# Patient Record
Sex: Male | Born: 1960 | Race: Black or African American | Hispanic: No | Marital: Single | Smoking: Current every day smoker
Health system: Southern US, Community
[De-identification: ages and names within clinical notes are randomized; demographics above are authoritative.]

## PROBLEM LIST (undated history)

## (undated) DIAGNOSIS — I639 Cerebral infarction, unspecified: Secondary | ICD-10-CM

## (undated) DIAGNOSIS — I10 Essential (primary) hypertension: Secondary | ICD-10-CM

## (undated) DIAGNOSIS — R569 Unspecified convulsions: Secondary | ICD-10-CM

## (undated) HISTORY — PX: HERNIA REPAIR: SHX51

---

## 2004-09-10 ENCOUNTER — Ambulatory Visit: Payer: Self-pay | Admitting: Family Medicine

## 2004-09-22 ENCOUNTER — Ambulatory Visit: Payer: Self-pay | Admitting: Internal Medicine

## 2004-09-22 ENCOUNTER — Ambulatory Visit: Payer: Self-pay | Admitting: *Deleted

## 2004-09-29 ENCOUNTER — Ambulatory Visit: Payer: Self-pay | Admitting: Internal Medicine

## 2006-05-26 ENCOUNTER — Emergency Department (HOSPITAL_COMMUNITY): Admission: EM | Admit: 2006-05-26 | Discharge: 2006-05-26 | Payer: Self-pay | Admitting: Family Medicine

## 2007-04-11 ENCOUNTER — Emergency Department (HOSPITAL_COMMUNITY): Admission: EM | Admit: 2007-04-11 | Discharge: 2007-04-11 | Payer: Self-pay | Admitting: Emergency Medicine

## 2007-05-28 ENCOUNTER — Emergency Department (HOSPITAL_COMMUNITY): Admission: EM | Admit: 2007-05-28 | Discharge: 2007-05-28 | Payer: Self-pay | Admitting: Emergency Medicine

## 2010-11-19 ENCOUNTER — Inpatient Hospital Stay (HOSPITAL_COMMUNITY)
Admission: EM | Admit: 2010-11-19 | Discharge: 2010-11-29 | Disposition: A | Discharge status: Rehabilitation Facility | Payer: Self-pay | Source: Home / Self Care | Attending: Neurology | Admitting: Neurology

## 2010-11-22 LAB — COMPREHENSIVE METABOLIC PANEL
ALT: 10 U/L (ref 0–53)
AST: 20 U/L (ref 0–37)
Albumin: 4.3 g/dL (ref 3.5–5.2)
Alkaline Phosphatase: 85 U/L (ref 39–117)
BUN: 7 mg/dL (ref 6–23)
CO2: 23 mEq/L (ref 19–32)
Calcium: 9.4 mg/dL (ref 8.4–10.5)
Chloride: 105 mEq/L (ref 96–112)
Creatinine, Ser: 1.03 mg/dL (ref 0.4–1.5)
GFR calc Af Amer: >60 mL/min (ref >60)
GFR calc non Af Amer: >60 mL/min (ref >60)
Glucose, Bld: 98 mg/dL (ref 70–99)
Potassium: 3.3 mEq/L — ABNORMAL LOW (ref 3.5–5.1)
Sodium: 140 mEq/L (ref 135–145)
Total Bilirubin: 0.9 mg/dL (ref 0.3–1.2)
Total Protein: 7.7 g/dL (ref 6.0–8.3)

## 2010-11-22 LAB — DIFFERENTIAL
Basophils Absolute: 0 10*3/uL (ref 0.0–0.1)
Basophils Relative: 0 % (ref 0–1)
Eosinophils Absolute: 0.1 10*3/uL (ref 0.0–0.7)
Eosinophils Relative: 1 % (ref 0–5)
Lymphocytes Relative: 27 % (ref 12–46)
Lymphs Abs: 2.7 10*3/uL (ref 0.7–4.0)
Monocytes Absolute: 0.5 10*3/uL (ref 0.1–1.0)
Monocytes Relative: 6 % (ref 3–12)
Neutro Abs: 6.5 10*3/uL (ref 1.7–7.7)
Neutrophils Relative %: 66 % (ref 43–77)

## 2010-11-22 LAB — GLUCOSE, CAPILLARY
Glucose-Capillary: 120 mg/dL — ABNORMAL HIGH (ref 70–99)
Glucose-Capillary: 126 mg/dL — ABNORMAL HIGH (ref 70–99)
Glucose-Capillary: 127 mg/dL — ABNORMAL HIGH (ref 70–99)
Glucose-Capillary: 138 mg/dL — ABNORMAL HIGH (ref 70–99)
Glucose-Capillary: 121 mg/dL — ABNORMAL HIGH (ref 70–99)
Glucose-Capillary: 133 mg/dL — ABNORMAL HIGH (ref 70–99)
Glucose-Capillary: 138 mg/dL — ABNORMAL HIGH (ref 70–99)
Glucose-Capillary: 183 mg/dL — ABNORMAL HIGH (ref 70–99)

## 2010-11-22 LAB — RAPID URINE DRUG SCREEN, HOSP PERFORMED
Amphetamines: NOT DETECTED
Barbiturates: NOT DETECTED
Benzodiazepines: NOT DETECTED
Cocaine: POSITIVE — AB
Opiates: NOT DETECTED
Tetrahydrocannabinol: NOT DETECTED

## 2010-11-22 LAB — BASIC METABOLIC PANEL
BUN: 7 mg/dL (ref 6–23)
CO2: 25 mEq/L (ref 19–32)
Calcium: 9.6 mg/dL (ref 8.4–10.5)
Chloride: 101 mEq/L (ref 96–112)
Creatinine, Ser: 0.94 mg/dL (ref 0.4–1.5)
GFR calc Af Amer: >60 mL/min (ref >60)
GFR calc non Af Amer: >60 mL/min (ref >60)
Glucose, Bld: 111 mg/dL — ABNORMAL HIGH (ref 70–99)
Potassium: 4.2 mEq/L (ref 3.5–5.1)
Sodium: 135 mEq/L (ref 135–145)

## 2010-11-22 LAB — CBC
HCT: 42.8 % (ref 39.0–52.0)
HCT: 41.7 % (ref 39.0–52.0)
Hemoglobin: 15.7 g/dL (ref 13.0–17.0)
Hemoglobin: 15.1 g/dL (ref 13.0–17.0)
MCH: 30.4 pg (ref 26.0–34.0)
MCH: 30.3 pg (ref 26.0–34.0)
MCHC: 36.7 g/dL — ABNORMAL HIGH (ref 30.0–36.0)
MCHC: 36.2 g/dL — ABNORMAL HIGH (ref 30.0–36.0)
MCV: 82.8 fL (ref 78.0–100.0)
MCV: 83.6 fL (ref 78.0–100.0)
Platelets: 139 10*3/uL — ABNORMAL LOW (ref 150–400)
Platelets: 131 10*3/uL — ABNORMAL LOW (ref 150–400)
RBC: 5.17 MIL/uL (ref 4.22–5.81)
RBC: 4.99 MIL/uL (ref 4.22–5.81)
RDW: 18.2 % — ABNORMAL HIGH (ref 11.5–15.5)
RDW: 17.9 % — ABNORMAL HIGH (ref 11.5–15.5)
WBC: 9.8 10*3/uL (ref 4.0–10.5)
WBC: 11.7 10*3/uL — ABNORMAL HIGH (ref 4.0–10.5)

## 2010-11-22 LAB — TROPONIN I: Troponin I: 0.02 ng/mL (ref 0.00–0.06)

## 2010-11-22 LAB — CK TOTAL AND CKMB (NOT AT ARMC)
CK, MB: 1.9 ng/mL (ref 0.3–4.0)
Relative Index: INVALID (ref 0.0–2.5)
Total CK: 81 U/L (ref 7–232)

## 2010-11-22 LAB — PROTIME-INR
INR: 0.98 (ref 0.00–1.49)
Prothrombin Time: 13.2 seconds (ref 11.6–15.2)

## 2010-11-22 LAB — MRSA PCR SCREENING: MRSA by PCR: NEGATIVE

## 2010-11-22 LAB — APTT: aPTT: 33 seconds (ref 24–37)

## 2010-11-24 LAB — LIPID PANEL
Cholesterol: 163 mg/dL (ref 0–200)
HDL: 47 mg/dL (ref >39)
LDL Cholesterol: 98 mg/dL (ref 0–99)
Total CHOL/HDL Ratio: 3.5 RATIO
Triglycerides: 92 mg/dL (ref <150)
VLDL: 18 mg/dL (ref 0–40)

## 2010-11-24 LAB — GLUCOSE, CAPILLARY
Glucose-Capillary: 109 mg/dL — ABNORMAL HIGH (ref 70–99)
Glucose-Capillary: 118 mg/dL — ABNORMAL HIGH (ref 70–99)
Glucose-Capillary: 130 mg/dL — ABNORMAL HIGH (ref 70–99)
Glucose-Capillary: 117 mg/dL — ABNORMAL HIGH (ref 70–99)
Glucose-Capillary: 130 mg/dL — ABNORMAL HIGH (ref 70–99)
Glucose-Capillary: 139 mg/dL — ABNORMAL HIGH (ref 70–99)
Glucose-Capillary: 154 mg/dL — ABNORMAL HIGH (ref 70–99)
Glucose-Capillary: 167 mg/dL — ABNORMAL HIGH (ref 70–99)
Glucose-Capillary: 175 mg/dL — ABNORMAL HIGH (ref 70–99)

## 2010-11-29 LAB — GLUCOSE, CAPILLARY
Glucose-Capillary: 111 mg/dL — ABNORMAL HIGH (ref 70–99)
Glucose-Capillary: 128 mg/dL — ABNORMAL HIGH (ref 70–99)
Glucose-Capillary: 137 mg/dL — ABNORMAL HIGH (ref 70–99)
Glucose-Capillary: 140 mg/dL — ABNORMAL HIGH (ref 70–99)
Glucose-Capillary: 148 mg/dL — ABNORMAL HIGH (ref 70–99)
Glucose-Capillary: 153 mg/dL — ABNORMAL HIGH (ref 70–99)
Glucose-Capillary: 107 mg/dL — ABNORMAL HIGH (ref 70–99)
Glucose-Capillary: 128 mg/dL — ABNORMAL HIGH (ref 70–99)
Glucose-Capillary: 136 mg/dL — ABNORMAL HIGH (ref 70–99)
Glucose-Capillary: 179 mg/dL — ABNORMAL HIGH (ref 70–99)
Glucose-Capillary: 142 mg/dL — ABNORMAL HIGH (ref 70–99)
Glucose-Capillary: 143 mg/dL — ABNORMAL HIGH (ref 70–99)
Glucose-Capillary: 151 mg/dL — ABNORMAL HIGH (ref 70–99)
Glucose-Capillary: 172 mg/dL — ABNORMAL HIGH (ref 70–99)

## 2010-11-30 LAB — GLUCOSE, CAPILLARY
Glucose-Capillary: 131 mg/dL — ABNORMAL HIGH (ref 70–99)
Glucose-Capillary: 124 mg/dL — ABNORMAL HIGH (ref 70–99)
Glucose-Capillary: 153 mg/dL — ABNORMAL HIGH (ref 70–99)
Glucose-Capillary: 125 mg/dL — ABNORMAL HIGH (ref 70–99)
Glucose-Capillary: 134 mg/dL — ABNORMAL HIGH (ref 70–99)
Glucose-Capillary: 160 mg/dL — ABNORMAL HIGH (ref 70–99)
Glucose-Capillary: 216 mg/dL — ABNORMAL HIGH (ref 70–99)
Glucose-Capillary: 132 mg/dL — ABNORMAL HIGH (ref 70–99)

## 2010-11-30 LAB — URINALYSIS, ROUTINE W REFLEX MICROSCOPIC
Bilirubin Urine: NEGATIVE
Ketones, ur: 15 mg/dL — AB
Specific Gravity, Urine: 1.015 (ref 1.005–1.030)
Urine Glucose, Fasting: NEGATIVE mg/dL
pH: 6 (ref 5.0–8.0)

## 2010-11-30 LAB — URINE MICROSCOPIC-ADD ON

## 2010-12-01 LAB — URINE CULTURE

## 2010-12-11 NOTE — Discharge Summary (Signed)
Eugene [name], Eugene [name]               ACCOUNT NO.:  [id number]  MEDICAL RECORD NO.:  [id number]          PATIENT TYPE:  INP  LOCATION:  3027                         FACILITY:  [facility]  PHYSICIAN:  Radhika Dershem P. Pearlean Brownie, MD    DATE OF BIRTH:  [birth year]  DATE OF ADMISSION:  11/19/2010 DATE OF DISCHARGE:  11/26/2010                        DISCHARGE SUMMARY - REFERRING   DISCHARGE DIAGNOSES: 1. 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 2. Hypertension. 3. Cocaine use.  MEDICINES AT TIME OF DISCHARGE: 1. Hydrochlorothiazide 25 mg a day. 2. Lisinopril 10 mg a day. 3. Clonidine 0.3 mg q.8 h.  STUDIES PERFORMED: 1. CT of the brain on admission shows large left frontal lobe     parenchymal hematoma with associated mass effect upon the adjacent     brain parenchyma with mild left-to-right midline shift.     Intraventricular hemorrhage.  Small vessel disease. 2. MRI of the brain shows large acute hematoma in the left frontal     parietal lobe.  Intraventricular hemorrhage.  No underlying mass or     lesion is noted. 3. MRA of the brain shows moderate stenosis distal right vertebral     artery.  No large vessel or vascular malformation.  Cannot exclude     vascular malformation and ACA. 4. EKG shows sinus rhythm.  LABORATORY STUDIES:  Cholesterol 163, triglycerides 92, HDL 47, LDL 98. Chemistry with glucose 111, otherwise normal.  CBC with white blood cells 11.7, hemoglobin 15.1, hematocrit 41.7, platelets 130.  MRSA negative.  Urine drug screen positive for cocaine.  Coagulation studies normal.  Cardiac enzymes normal.  HISTORY OF PRESENT ILLNESS:  Eugene [name] is a 50 year old African American male with past medical history of hypertension.  The patient was found by his girlfriend at 2 a.m. on the morning of November 19, 2010.  The patient was found to get up to go to the bathroom and it was noted he was stumbling and his words were incorrect.   EMS was called and the patient was brought to [facility] Emergency Department.  In the emergency department, CT of the brain showed a large left frontal lobe intraparenchymal hemorrhage which measured 6.7 x 8.0 x 0.6.  There was also left-to-right midline shift measuring 6 mm.  The patient remained stable in the emergency department.  However, his blood pressure was significantly high.  He was started on labetalol.  The patient was started on Dilantin for seizure prophylaxis.  No seizure occurred.  He was admitted to the neuro ICU.  HOSPITAL COURSE:  EEG was negative for seizure.  Dilantin was eventually stopped.  MRI confirmed the hemorrhage with no underlying etiology such as aneurysm or other lesion.  He remained stable in the ICU. He was transferred to the floor.  He was evaluated by PT, OT and speech therapy. All agreed his need for rehab. As patient's girlfriend is in [place], but his daughter is in [place], the daughter preferred rehab in [place]. Daughter applied for medicaid, rehab bed was obtained in [place] and the patient is stable for transfer.  Blood pressure remained  a significant issue during his hospitalization. At time of discharge, he is on 3 agents - clonidine at 0.3 q.8 h. as well as hydrochlorothiazide and lisinopril.  This has allowed his blood pressure to drop into the 140's to 80's.  He is stable for ongoing therapy  given his elevated blood pressure.  Given his high doses of hypertensives, his blood pressure needs to be watched closely and clonidine decreased if blood pressure decreases.  CONDITION AT TIME OF DISCHARGE:  Patient awake and alert.  He is oriented to person, though he has expressive aphasia.  He has a dense right hematemesis, though he moves his left side well.  He follows commands.  He has right lower facial weakness.  His heart rate is regular.  His breath sounds are clear.  DISCHARGE/PLAN: 1. Transfer to [facility]  for rehab hospital admission     for ongoing PT, OT and speech therapy. 2. The patient's daughter to transport to [place] in private car. 3. Ongoing blood pressure management with eventual goal 130/90 or     less. 4. Follow up with primary care physician in 1 month. 5. Follow up with Dr. Delia Heady or neurologist in [place] in 2-3     months.     Eugene [name], N.P.   ______________________________ Sunny Schlein. Pearlean Brownie, MD    SB/MEDQ  D:  11/26/2010  T:  11/26/2010  Job:  [id number]  cc:   Addalyn Speedy P. Pearlean Brownie, MD  Electronically Signed by Eugene [name] N.P. on 12/06/2010 04:18:17 PM Electronically Signed by Delia Heady MD on 12/11/2010 01:12:53 PM

## 2010-12-22 NOTE — H&P (Signed)
NAMEJULIANO, Eugene [name]               ACCOUNT NO.:  [id number]  MEDICAL RECORD NO.:  [id number]           PATIENT TYPE:  LOCATION:                                 FACILITY:  PHYSICIAN:  Melvyn Novas, M.D.  DATE OF BIRTH:  January 10, 1961  DATE OF ADMISSION: DATE OF DISCHARGE:                             HISTORY & PHYSICAL   REASON FOR ADMISSION:  Large, left intracranial hemorrhage.  HISTORY OF PRESENT ILLNESS:  This is a 50 year old African American male with past medical history of hypertension.  The patient was found by his girlfriend at approximately 2:00 a.m. on the morning of November 19, 2009.  The patient was found to get up to go to the bathroom, and it was noted that he was stumbling and that his words were incorrect.  EMS was called, and patient was immediately brought to [facility] Emergency Department.  While in [facility] Emergency Department, patient obtained CT of his head which showed a large, left frontal lobe parenchymal hematoma which measured 6.7 x 8.0 x 0.6 cm.  There was also noted a left- to-right midline shift measuring 6 mm.  The patient remained stable while in the emergency department; however, his blood pressure was significantly high and he was started on labetalol.  Neurology was consulted at that time, at approximately 6:00 a.m. in the morning.  We arrived at approximately 8:30 in the morning to consult on the patient in the emergency department.  At that time, patient's blood pressure was 166/99, heart rate 84, and respirations 12.  The patient was stable and able to answer some questions; however, he showed expressive aphasia.  PAST MEDICAL HISTORY:  Positive for hypertension.  Stroke risk factor of hypertension.  MEDICATIONS:  The patient takes no medications at home.  ALLERGIES:  The patient is not allergic to any medications or environmental factors.  FAMILY HISTORY:  Noncontributory.  SOCIAL HISTORY:  The patient does take part in illicit  drug use, which includes cocaine.  Drinks occasionally.  Does not smoke.  REVIEW OF SYSTEMS:  Not obtainable at the present time.  PHYSICAL EXAMINATION:  VITAL SIGNS:  Temperature is 98.1, blood pressure 166/99, heart rate 84, respirations 12. LUNGS:  Clear to auscultation bilaterally. CARDIOVASCULAR:  Regular rate and rhythm. NECK:  Negative bruits. ABDOMEN:  Soft, nontender, nondistended. SKIN:  Warm, dry, and intact. NEUROLOGIC:  Pupils are equal, round, reactive to light and accommodating.  Extraocular movements grossly intact.  The patient does show mild slurred speech and mild expressive aphasia.  The patient is able to tell me his name, but he is unable to make sentences to tell us where he is and what had happened to him.  He does not show any facial droop and facial sensation is stated to be intact bilaterally.  The patient's smile is symmetrical and tongue is midline.  The patient is lying supine in the hospital bed.  He is able to move his left arm and left leg without any difficulty; however, his right arm, he shows effort to move it against gravity and makes attempt, but shows minimal strength.  The patient's right grip is  minimal.  The patient's right leg shows no effort against gravity; however, his tone throughout bilateral upper and lower extremities is intact.  As far as vision, patient's vision was intact bilaterally.  Deep tendon reflexes are 1+, upper extremities bilaterally; 0 at the right patella, 2 on the left patella, 0 on the right Achilles, 1 at the left Achilles.  He has equivocal toe on the right and upgoing toe on the left.  When questioned about sensation, patient states that bilateral sensation is equal; however, he could not give a good in depth and detailed response such as pinprick; light touch was equal bilaterally.  He could merely tell us that his sensation, he could feel both sides.  TEST RESULTS:  Sodium is 140, potassium 3.3, chloride 105,  CO2 of 22, BUN 7, creatinine is 1.03, and glucose 98.  White blood cell count 9.8, hemoglobin 15.7, and hematocrit 42.8.  Tox screen was positive for cocaine.  EKG showed normal sinus rhythm.  CT of head, as noted above, shows large left frontal lobe parenchymal hematoma which measures 6.7 x 8 x 0.6 cm with a 6 mm shift, left to right.  Acute blood clot is noted within the left lateral ventricle as well as layering in the dependent portion of the right ventricle.  The majority of this bleed seems to have come from left basal ganglia.  ASSESSMENT:  This is a 50 year old male who presents to the emergency department with intracranial hemorrhage, most notable in the left basal ganglia with some access to the ventricles; this hemorrhage is most likely secondary to cocaine use.  PLAN:  At this point, we will admit the patient to 3100 neuro ICU and continue to monitor blood pressure, blood glucose, labs, and to continue with vital signs and neuro checks q.2 h.  We will also start patient on Dilantin IV load and continue with Dilantin for seizure prophylaxis.  In this consultation, approximately an hour and half were spent with patient and family; patient's previous scans, labs, medications. Diagnostic labs and values were obtained, in addition approximately 1 hour was spent speaking with family members outside the patient's room.     Felicie Morn, PA-C   ______________________________ Melvyn Novas, M.D.    DS/MEDQ  D:  11/19/2010  T:  11/20/2010  Job:  [id number]  Electronically Signed by Felicie Morn PA-C on 11/24/2010 12:20:00 PM Electronically Signed by Melvyn Novas M.D. on 12/22/2010 09:35:23 AM

## 2011-06-20 ENCOUNTER — Emergency Department (HOSPITAL_COMMUNITY): Payer: Medicaid Other

## 2011-06-20 ENCOUNTER — Emergency Department (HOSPITAL_COMMUNITY)
Admission: EM | Admit: 2011-06-20 | Discharge: 2011-06-20 | Disposition: A | Discharge status: Home / Self Care | Payer: Medicaid Other | Attending: Emergency Medicine | Admitting: Emergency Medicine

## 2011-06-20 DIAGNOSIS — R5381 Other malaise: Secondary | ICD-10-CM | POA: Insufficient documentation

## 2011-06-20 DIAGNOSIS — Z8679 Personal history of other diseases of the circulatory system: Secondary | ICD-10-CM | POA: Insufficient documentation

## 2011-06-20 DIAGNOSIS — K219 Gastro-esophageal reflux disease without esophagitis: Secondary | ICD-10-CM | POA: Insufficient documentation

## 2011-06-20 DIAGNOSIS — R55 Syncope and collapse: Secondary | ICD-10-CM | POA: Insufficient documentation

## 2011-06-20 DIAGNOSIS — R5383 Other fatigue: Secondary | ICD-10-CM | POA: Insufficient documentation

## 2011-06-20 DIAGNOSIS — I10 Essential (primary) hypertension: Secondary | ICD-10-CM | POA: Insufficient documentation

## 2011-06-20 LAB — DIFFERENTIAL
Basophils Absolute: 0.1 10*3/uL (ref 0.0–0.1)
Eosinophils Absolute: 0.1 10*3/uL (ref 0.0–0.7)
Eosinophils Relative: 1 % (ref 0–5)
Lymphocytes Relative: 17 % (ref 12–46)
Lymphs Abs: 1.6 10*3/uL (ref 0.7–4.0)
Neutrophils Relative %: 76 % (ref 43–77)

## 2011-06-20 LAB — BASIC METABOLIC PANEL
CO2: 29 mEq/L (ref 19–32)
Chloride: 102 mEq/L (ref 96–112)
Creatinine, Ser: 0.97 mg/dL (ref 0.50–1.35)
GFR calc Af Amer: >60 mL/min (ref >60)
Potassium: 3.7 mEq/L (ref 3.5–5.1)

## 2011-06-20 LAB — CBC
HCT: 36.8 % — ABNORMAL LOW (ref 39.0–52.0)
MCV: 77.8 fL — ABNORMAL LOW (ref 78.0–100.0)
Platelets: 122 10*3/uL — ABNORMAL LOW (ref 150–400)
RBC: 4.73 MIL/uL (ref 4.22–5.81)
RDW: 18.6 % — ABNORMAL HIGH (ref 11.5–15.5)
WBC: 9.3 10*3/uL (ref 4.0–10.5)

## 2011-10-08 ENCOUNTER — Emergency Department (HOSPITAL_COMMUNITY)
Admission: EM | Admit: 2011-10-08 | Discharge: 2011-10-08 | Disposition: A | Discharge status: Home / Self Care | Payer: Medicaid Other | Attending: Emergency Medicine | Admitting: Emergency Medicine

## 2011-10-08 ENCOUNTER — Encounter: Payer: Self-pay | Admitting: *Deleted

## 2011-10-08 DIAGNOSIS — Z79899 Other long term (current) drug therapy: Secondary | ICD-10-CM | POA: Insufficient documentation

## 2011-10-08 DIAGNOSIS — G40909 Epilepsy, unspecified, not intractable, without status epilepticus: Secondary | ICD-10-CM | POA: Insufficient documentation

## 2011-10-08 DIAGNOSIS — I10 Essential (primary) hypertension: Secondary | ICD-10-CM | POA: Insufficient documentation

## 2011-10-08 DIAGNOSIS — Z8673 Personal history of transient ischemic attack (TIA), and cerebral infarction without residual deficits: Secondary | ICD-10-CM | POA: Insufficient documentation

## 2011-10-08 HISTORY — DX: Unspecified convulsions: R56.9

## 2011-10-08 HISTORY — DX: Essential (primary) hypertension: I10

## 2011-10-08 HISTORY — DX: Cerebral infarction, unspecified: I63.9

## 2011-10-08 LAB — URINALYSIS, ROUTINE W REFLEX MICROSCOPIC
Glucose, UA: NEGATIVE mg/dL
Hgb urine dipstick: NEGATIVE
Ketones, ur: NEGATIVE mg/dL
Leukocytes, UA: NEGATIVE
Protein, ur: NEGATIVE mg/dL
pH: 6 (ref 5.0–8.0)

## 2011-10-08 LAB — CBC
HCT: 36.6 % — ABNORMAL LOW (ref 39.0–52.0)
Hemoglobin: 13.3 g/dL (ref 13.0–17.0)
MCV: 80.6 fL (ref 78.0–100.0)
RBC: 4.54 MIL/uL (ref 4.22–5.81)
WBC: 8.1 10*3/uL (ref 4.0–10.5)

## 2011-10-08 LAB — DIFFERENTIAL
Basophils Absolute: 0.1 10*3/uL (ref 0.0–0.1)
Eosinophils Relative: 2 % (ref 0–5)
Lymphocytes Relative: 25 % (ref 12–46)
Lymphs Abs: 2 10*3/uL (ref 0.7–4.0)
Monocytes Absolute: 0.5 10*3/uL (ref 0.1–1.0)
Monocytes Relative: 6 % (ref 3–12)
Neutro Abs: 5.4 10*3/uL (ref 1.7–7.7)

## 2011-10-08 LAB — BASIC METABOLIC PANEL
BUN: 16 mg/dL (ref 6–23)
CO2: 28 mEq/L (ref 19–32)
Calcium: 9.4 mg/dL (ref 8.4–10.5)
Chloride: 101 mEq/L (ref 96–112)
Creatinine, Ser: 1.19 mg/dL (ref 0.50–1.35)
Glucose, Bld: 108 mg/dL — ABNORMAL HIGH (ref 70–99)

## 2011-10-08 MED ORDER — LORAZEPAM 2 MG/ML IJ SOLN
1.0000 mg | Freq: Once | INTRAMUSCULAR | Status: AC
  Administered 2011-10-08: 1 mg via INTRAVENOUS
  Filled 2011-10-08: qty 1

## 2011-10-08 MED ORDER — LEVETIRACETAM 500 MG PO TABS
1000.0000 mg | ORAL_TABLET | Freq: Once | ORAL | Status: AC
  Administered 2011-10-08: 1000 mg via ORAL
  Filled 2011-10-08: qty 2

## 2011-10-08 MED ORDER — LEVETIRACETAM 250 MG PO TABS: 250.0000 mg | ORAL_TABLET | Freq: Two times a day (BID) | ORAL | Status: DC

## 2011-10-08 NOTE — ED Notes (Signed)
the patient states he was sitting in a chair when his right leg suddenly felt numb and he could not move it. the patient states he does remember the seizure. He states "the seizure came on gradually." the patient is A&O x 4 in no apparent distress. Will continue to monitor

## 2011-10-08 NOTE — ED Notes (Signed)
Family at bedside. 

## 2011-10-08 NOTE — ED Notes (Signed)
Requested urine and given a urinal

## 2011-10-08 NOTE — ED Provider Notes (Signed)
History     CSN: [id number] Arrival date & time: 10/08/2011  1:39 PM   None     Chief Complaint  Patient presents with  . Seizures    (Consider location/radiation/quality/duration/timing/severity/associated sxs/prior treatment) HPI History provided by pt, family member and prior chart.  Per prior chart, pt admitted to hospital for hemorrhagic stroke in 11/2010.  Pt reports that he developed a seizure disorder this year and his neurologist attributed to residual brain edema.  Refused to start seizure medication d/t potential SE.  Presents today w/ his second seizure.  Family members report that he turned his head to the left, eyes rolled to back of his head and he had full body tremors.  Seizure lasted for approx 10 minutes and he was confused for 15 minutes afterwards.  Did not bite tongue and was not incontinent of urine. No recent illnesses including fever, cough, vomiting, diarrhea, urinary sx.   Past Medical History  Diagnosis Date  . Seizures   . Hypertension   . Stroke     History reviewed. No pertinent past surgical history.  No family history on file.  History  Substance Use Topics  . Smoking status: Not on file  . Smokeless tobacco: Not on file  . Alcohol Use:       Review of Systems  All other systems reviewed and are negative.    Allergies  Review of patient's allergies indicates no known allergies.  Home Medications   Current Outpatient Rx  Name Route Sig Dispense Refill  . AMANTADINE HCL 100 MG PO CAPS Oral Take 100 mg by mouth daily.     . ASPIRIN EC 81 MG PO TBEC Oral Take 81 mg by mouth daily.      Marland Kitchen LOSARTAN POTASSIUM 100 MG PO TABS Oral Take 100 mg by mouth daily.      Marland Kitchen LOSARTAN POTASSIUM-HCTZ 100-25 MG PO TABS Oral Take 1 tablet by mouth daily.      Marland Kitchen PRAVASTATIN SODIUM 10 MG PO TABS Oral Take 10 mg by mouth daily.      Marland Kitchen PRAVASTATIN SODIUM 20 MG PO TABS Oral Take 20 mg by mouth daily.      Marland Kitchen VERAPAMIL HCL 120 MG PO TABS Oral Take 120 mg by  mouth 3 (three) times daily.      Marland Kitchen VERAPAMIL HCL 80 MG PO TABS Oral Take 80 mg by mouth 3 (three) times daily.        BP 140/88  Pulse 95  Temp(Src) 98.3 F (36.8 C) (Oral)  Resp 17  SpO2 100%  Physical Exam  Nursing note and vitals reviewed. Constitutional: He is oriented to person, place, and time. He appears well-developed and well-nourished. No distress.  HENT:  Head: Normocephalic and atraumatic.  Eyes:       Normal appearance  Neck: Normal range of motion.  Cardiovascular: Normal rate and regular rhythm.   Pulmonary/Chest: Effort normal and breath sounds normal.  Abdominal: Soft. Bowel sounds are normal. He exhibits no distension. There is no tenderness.  Musculoskeletal: Normal range of motion.  Neurological: He is alert and oriented to person, place, and time. He has normal reflexes. No cranial nerve deficit or sensory deficit. He displays a negative Romberg sign. Coordination and gait normal.       5/5 and equal upper and lower extremity strength.  No past pointing.    Skin: Skin is warm and dry. No rash noted.  Psychiatric: He has a normal mood and affect. His behavior is  normal.    ED Course  Procedures (including critical care time)  Labs Reviewed  CBC - Abnormal; Notable for the following:    HCT 36.6 (*)    MCHC 36.3 (*)    RDW 19.9 (*)    Platelets 125 (*)    All other components within normal limits  BASIC METABOLIC PANEL - Abnormal; Notable for the following:    Glucose, Bld 108 (*)    GFR calc non Af Amer 70 (*)    GFR calc Af Amer 81 (*)    All other components within normal limits  DIFFERENTIAL  URINALYSIS, ROUTINE W REFLEX MICROSCOPIC   No results found.   1. Seizure disorder       MDM  Pt developed seizure disorder following hemorrhagic stroke in 11/2010.  Has refused to take seizure medication in the past d/t fear of SE.  Presents today w/ witnessed seizure.  Pt looks well and no focal neuro deficits on exam.  Labs unremarkable.   Consulted Dr. Pearlean Brownie who recommends 1g Keppra loading dose, 250mg  bid and neuro f/u.  Pt agreeable to starting medication.  Return precautions discussed.      Otilio Miu, [place] 10/08/11 [phone]

## 2011-10-08 NOTE — ED Notes (Signed)
bs 131

## 2011-10-08 NOTE — ED Notes (Signed)
Claification:  Pt has only had 2 seizures, has not been dx with a seizure disorder, sts he saw a neuro md about 2 months ago

## 2011-10-09 NOTE — ED Provider Notes (Signed)
Medical screening examination/treatment/procedure(s) were performed by non-physician practitioner and as supervising physician I was immediately available for consultation/collaboration.   Dayton Bailiff, MD 10/09/11 [phone]

## 2011-10-12 ENCOUNTER — Encounter (HOSPITAL_COMMUNITY): Payer: Self-pay | Admitting: Emergency Medicine

## 2013-02-14 ENCOUNTER — Ambulatory Visit: Payer: Medicaid Other | Admitting: Physical Therapy

## 2013-02-15 ENCOUNTER — Ambulatory Visit: Payer: Medicaid Other | Attending: Internal Medicine | Admitting: Physical Therapy

## 2013-03-23 ENCOUNTER — Encounter (HOSPITAL_COMMUNITY): Payer: Self-pay | Admitting: *Deleted

## 2013-03-23 ENCOUNTER — Emergency Department (HOSPITAL_COMMUNITY)
Admission: EM | Admit: 2013-03-23 | Discharge: 2013-03-24 | Disposition: A | Discharge status: Home / Self Care | Payer: Medicaid Other | Attending: Emergency Medicine | Admitting: Emergency Medicine

## 2013-03-23 DIAGNOSIS — I10 Essential (primary) hypertension: Secondary | ICD-10-CM | POA: Insufficient documentation

## 2013-03-23 DIAGNOSIS — K089 Disorder of teeth and supporting structures, unspecified: Secondary | ICD-10-CM | POA: Insufficient documentation

## 2013-03-23 DIAGNOSIS — G40909 Epilepsy, unspecified, not intractable, without status epilepticus: Secondary | ICD-10-CM | POA: Insufficient documentation

## 2013-03-23 DIAGNOSIS — K0889 Other specified disorders of teeth and supporting structures: Secondary | ICD-10-CM

## 2013-03-23 DIAGNOSIS — Z7982 Long term (current) use of aspirin: Secondary | ICD-10-CM | POA: Insufficient documentation

## 2013-03-23 DIAGNOSIS — Z79899 Other long term (current) drug therapy: Secondary | ICD-10-CM | POA: Insufficient documentation

## 2013-03-23 DIAGNOSIS — Z8673 Personal history of transient ischemic attack (TIA), and cerebral infarction without residual deficits: Secondary | ICD-10-CM | POA: Insufficient documentation

## 2013-03-23 MED ORDER — AMOXICILLIN 500 MG PO CAPS
500.0000 mg | ORAL_CAPSULE | Freq: Once | ORAL | Status: AC
  Administered 2013-03-23: 500 mg via ORAL

## 2013-03-23 MED ORDER — OXYCODONE-ACETAMINOPHEN 5-325 MG PO TABS
1.0000 | ORAL_TABLET | Freq: Once | ORAL | Status: AC
  Administered 2013-03-23: 1 via ORAL
  Filled 2013-03-23: qty 1

## 2013-03-23 NOTE — ED Provider Notes (Signed)
History    This chart was scribed for Jaynie Crumble (PA) non-physician practitioner working with Jones Skene, MD by Sofie Rower, ED Scribe. This patient was seen in room WTR2/WLPT2 and the patient's care was started at 11:42PM.   CSN: [id number]  Arrival date & time 03/23/13  2330   First MD Initiated Contact with Patient 03/23/13 2342      Chief Complaint  Patient presents with  . Dental Pain    (Consider location/radiation/quality/duration/timing/severity/associated sxs/prior treatment) The history is provided by the patient. No language interpreter was used.    Eugene [name] is a 52 y.o. male who presents to the Emergency Department complaining of sudden, progressively worsening, non radiating dental pain, located at the right upper jaw, onset yesterday (03/22/13). The pt reports he was been experiencing a progressively worsening toothache since yesterday (03/22/13). The pt has applied Orajel, which he informs, does not provide relief of the dental pain. Furthermore, the pt rates his dental pain at a 10/10 at present. Modifying factors include certain movements and positions of the jaw, specifically biting down with the right side of the jaw, which intensifies the dental pain.   The pt denies fever and otalgia.   The pt does not smoke or drink alcohol.   Pt does not have a PCP.    Past Medical History  Diagnosis Date  . Seizures   . Hypertension   . Stroke     History reviewed. No pertinent past surgical history.  History reviewed. No pertinent family history.  History  Substance Use Topics  . Smoking status: Not on file  . Smokeless tobacco: Not on file  . Alcohol Use:       Review of Systems  Constitutional: Negative for fever.  HENT: Positive for dental problem. Negative for ear pain.   All other systems reviewed and are negative.    Allergies  Review of patient's allergies indicates no known allergies.  Home Medications   Current Outpatient Rx   Name  Route  Sig  Dispense  Refill  . acetaminophen (TYLENOL) 500 MG tablet   Oral   Take 500-1,000 mg by mouth every 6 (six) hours as needed for pain (toothache).         Marland Kitchen aspirin EC 81 MG tablet   Oral   Take 81 mg by mouth every morning.          . benzocaine (ORAJEL) 10 % mucosal gel   Mouth/Throat   Use as directed 1 application in the mouth or throat 3 (three) times daily as needed for pain.         Marland Kitchen levETIRAcetam (KEPPRA) 1000 MG tablet   Oral   Take 1,000 mg by mouth 2 (two) times daily.         . Multiple Vitamin (MULTIVITAMIN WITH MINERALS) TABS   Oral   Take 1 tablet by mouth every morning.         . verapamil (CALAN) 80 MG tablet   Oral   Take 80 mg by mouth 3 (three) times daily.             BP 173/100  Pulse 68  Temp(Src) 99.3 F (37.4 C) (Oral)  Resp 16  SpO2 99%  Physical Exam  Nursing note and vitals reviewed. Constitutional: He is oriented to person, place, and time. He appears well-developed and well-nourished. No distress.  HENT:  Head: Normocephalic and atraumatic.  Right Ear: Tympanic membrane, external ear and ear canal normal.  Left Ear: Tympanic  membrane, external ear and ear canal normal.  Nose: Nose normal.  Mouth/Throat: No oropharyngeal exudate.  Cavity within the right upper bicuspid. Surrounding gum swelling and tenderness.  Eyes: EOM are normal.  Neck: Normal range of motion. Neck supple. No tracheal deviation present.   No swollen lymph nodes.   Cardiovascular: Normal rate.   Pulmonary/Chest: Effort normal. No respiratory distress.  Musculoskeletal: Normal range of motion.  Lymphadenopathy:    He has no cervical adenopathy.  Neurological: He is alert and oriented to person, place, and time.  Skin: Skin is warm and dry.  Psychiatric: He has a normal mood and affect. His behavior is normal.    ED Course  Procedures (including critical care time)  DIAGNOSTIC STUDIES: Oxygen Saturation is 99% on room air,  normal3 by my interpretation.    COORDINATION OF CARE:  11:46 PM- Treatment plan discussed with patient. Pt agrees with treatment.      Labs Reviewed - No data to display No results found.   1. Pain, dental   2. Hypertension       MDM  PT with dental pain. Possible early infection. Plan percocet in ED, norco and ibuprofen home. Start on amoxil. Pt has apt with dentist in 2 days. Afebrile here. BP elevated, states due to pain. Will need close follow up.  Filed Vitals:   03/23/13 2346 03/24/13 0034 03/24/13 0059  BP: 173/100 161/90 163/94  Pulse: 68 64   Temp: 99.3 F (37.4 C)    TempSrc: Oral    Resp: 16 18 18   SpO2: 99% 100% 98%      I personally performed the services described in this documentation, which was scribed in my presence. The recorded information has been reviewed and is accurate.    Lottie Mussel, PA-C 03/24/13 0205

## 2013-03-23 NOTE — ED Notes (Signed)
Pt c/o of toothache started yesterday. Taken oral gel, OTC pain relievers without relief

## 2013-03-24 MED ORDER — HYDROCODONE-ACETAMINOPHEN 5-325 MG PO TABS: 1.0000 | ORAL_TABLET | Freq: Four times a day (QID) | ORAL | Status: DC | PRN

## 2013-03-24 MED ORDER — AMOXICILLIN 500 MG PO CAPS: 500.0000 mg | ORAL_CAPSULE | Freq: Three times a day (TID) | ORAL | Status: DC

## 2013-03-24 NOTE — ED Provider Notes (Signed)
Medical screening examination/treatment/procedure(s) were performed by non-physician practitioner and as supervising physician I was immediately available for consultation/collaboration.  John-Adam Sherlyne Crownover, M.D.     John-Adam Maccoy Haubner, MD 03/24/13 0830 

## 2013-03-24 NOTE — Discharge Instructions (Signed)
Continue ibuprofen for pain. Amoxil as prescribed until all gone. norco for severe pain. Follow up with dentist as scheduled.    Dental Pain A tooth ache may be caused by cavities (tooth decay). Cavities expose the nerve of the tooth to air and hot or cold temperatures. It may come from an infection or abscess (also called a boil or furuncle) around your tooth. It is also often caused by dental caries (tooth decay). This causes the pain you are having. DIAGNOSIS  Your caregiver can diagnose this problem by exam. TREATMENT   If caused by an infection, it may be treated with medications which kill germs (antibiotics) and pain medications as prescribed by your caregiver. Take medications as directed.  Only take over-the-counter or prescription medicines for pain, discomfort, or fever as directed by your caregiver.  Whether the tooth ache today is caused by infection or dental disease, you should see your dentist as soon as possible for further care. SEEK MEDICAL CARE IF: The exam and treatment you received today has been provided on an emergency basis only. This is not a substitute for complete medical or dental care. If your problem worsens or new problems (symptoms) appear, and you are unable to meet with your dentist, call or return to this location. SEEK IMMEDIATE MEDICAL CARE IF:   You have a fever.  You develop redness and swelling of your face, jaw, or neck.  You are unable to open your mouth.  You have severe pain uncontrolled by pain medicine. MAKE SURE YOU:   Understand these instructions.  Will watch your condition.  Will get help right away if you are not doing well or get worse. Document Released: 10/24/2005 Document Revised: 01/16/2012 Document Reviewed: 06/11/2008 [facility] Patient Information 2013 [place], [place].

## 2013-04-04 ENCOUNTER — Ambulatory Visit: Payer: Medicaid Other | Attending: Internal Medicine | Admitting: Physical Therapy

## 2013-04-04 DIAGNOSIS — R269 Unspecified abnormalities of gait and mobility: Secondary | ICD-10-CM | POA: Insufficient documentation

## 2013-04-04 DIAGNOSIS — IMO0001 Reserved for inherently not codable concepts without codable children: Secondary | ICD-10-CM | POA: Insufficient documentation

## 2013-04-04 DIAGNOSIS — M6281 Muscle weakness (generalized): Secondary | ICD-10-CM | POA: Insufficient documentation

## 2013-04-11 ENCOUNTER — Ambulatory Visit: Payer: Medicaid Other | Attending: Internal Medicine | Admitting: Physical Therapy

## 2013-04-11 DIAGNOSIS — R269 Unspecified abnormalities of gait and mobility: Secondary | ICD-10-CM | POA: Insufficient documentation

## 2013-04-11 DIAGNOSIS — M6281 Muscle weakness (generalized): Secondary | ICD-10-CM | POA: Insufficient documentation

## 2013-04-11 DIAGNOSIS — IMO0001 Reserved for inherently not codable concepts without codable children: Secondary | ICD-10-CM | POA: Insufficient documentation

## 2013-04-18 ENCOUNTER — Ambulatory Visit: Payer: Medicaid Other | Admitting: Physical Therapy

## 2013-04-25 ENCOUNTER — Ambulatory Visit: Payer: Medicaid Other | Admitting: Physical Therapy

## 2013-05-04 ENCOUNTER — Emergency Department (HOSPITAL_COMMUNITY): Payer: Medicaid Other

## 2013-05-04 ENCOUNTER — Encounter (HOSPITAL_COMMUNITY): Payer: Self-pay | Admitting: Emergency Medicine

## 2013-05-04 ENCOUNTER — Observation Stay (HOSPITAL_COMMUNITY)
Admission: EM | Admit: 2013-05-04 | Discharge: 2013-05-05 | Disposition: A | Discharge status: Home / Self Care | Payer: Medicaid Other | Attending: Internal Medicine | Admitting: Internal Medicine

## 2013-05-04 DIAGNOSIS — Z792 Long term (current) use of antibiotics: Secondary | ICD-10-CM | POA: Insufficient documentation

## 2013-05-04 DIAGNOSIS — G9389 Other specified disorders of brain: Secondary | ICD-10-CM | POA: Insufficient documentation

## 2013-05-04 DIAGNOSIS — Z79899 Other long term (current) drug therapy: Secondary | ICD-10-CM | POA: Insufficient documentation

## 2013-05-04 DIAGNOSIS — D696 Thrombocytopenia, unspecified: Secondary | ICD-10-CM | POA: Insufficient documentation

## 2013-05-04 DIAGNOSIS — G319 Degenerative disease of nervous system, unspecified: Secondary | ICD-10-CM | POA: Insufficient documentation

## 2013-05-04 DIAGNOSIS — I69959 Hemiplegia and hemiparesis following unspecified cerebrovascular disease affecting unspecified side: Secondary | ICD-10-CM | POA: Insufficient documentation

## 2013-05-04 DIAGNOSIS — I69359 Hemiplegia and hemiparesis following cerebral infarction affecting unspecified side: Secondary | ICD-10-CM

## 2013-05-04 DIAGNOSIS — I10 Essential (primary) hypertension: Secondary | ICD-10-CM | POA: Diagnosis present

## 2013-05-04 DIAGNOSIS — G459 Transient cerebral ischemic attack, unspecified: Principal | ICD-10-CM | POA: Insufficient documentation

## 2013-05-04 DIAGNOSIS — F1911 Other psychoactive substance abuse, in remission: Secondary | ICD-10-CM | POA: Diagnosis present

## 2013-05-04 LAB — COMPREHENSIVE METABOLIC PANEL
ALT: 20 U/L (ref 0–53)
AST: 24 U/L (ref 0–37)
Albumin: 4 g/dL (ref 3.5–5.2)
Alkaline Phosphatase: 74 U/L (ref 39–117)
BUN: 16 mg/dL (ref 6–23)
CO2: 27 mEq/L (ref 19–32)
Calcium: 10.1 mg/dL (ref 8.4–10.5)
Chloride: 99 mEq/L (ref 96–112)
Creatinine, Ser: 1.06 mg/dL (ref 0.50–1.35)
GFR calc Af Amer: >90 mL/min (ref >90)
GFR calc non Af Amer: 79 mL/min — ABNORMAL LOW (ref >90)
Glucose, Bld: 90 mg/dL (ref 70–99)
Potassium: 3.8 mEq/L (ref 3.5–5.1)
Sodium: 136 mEq/L (ref 135–145)
Total Bilirubin: 0.9 mg/dL (ref 0.3–1.2)
Total Protein: 7.8 g/dL (ref 6.0–8.3)

## 2013-05-04 LAB — URINALYSIS, ROUTINE W REFLEX MICROSCOPIC
Bilirubin Urine: NEGATIVE
Glucose, UA: NEGATIVE mg/dL
Hgb urine dipstick: NEGATIVE
Ketones, ur: NEGATIVE mg/dL
Leukocytes, UA: NEGATIVE
Nitrite: NEGATIVE
Protein, ur: NEGATIVE mg/dL
Specific Gravity, Urine: 1.013 (ref 1.005–1.030)
Urobilinogen, UA: 1 mg/dL (ref 0.0–1.0)
pH: 7 (ref 5.0–8.0)

## 2013-05-04 LAB — CBC
HCT: 36.7 % — ABNORMAL LOW (ref 39.0–52.0)
Hemoglobin: 13.8 g/dL (ref 13.0–17.0)
MCH: 30.3 pg (ref 26.0–34.0)
MCHC: 37 g/dL — ABNORMAL HIGH (ref 30.0–36.0)
MCV: 80.7 fL (ref 78.0–100.0)
Platelets: 108 10*3/uL — ABNORMAL LOW (ref 150–400)
RBC: 4.55 MIL/uL (ref 4.22–5.81)
RDW: 18.1 % — ABNORMAL HIGH (ref 11.5–15.5)
WBC: 9.1 10*3/uL (ref 4.0–10.5)

## 2013-05-04 LAB — POCT I-STAT TROPONIN I

## 2013-05-04 LAB — RAPID URINE DRUG SCREEN, HOSP PERFORMED
Amphetamines: NOT DETECTED
Barbiturates: NOT DETECTED
Benzodiazepines: NOT DETECTED
Cocaine: NOT DETECTED
Opiates: NOT DETECTED
Tetrahydrocannabinol: NOT DETECTED

## 2013-05-04 LAB — APTT: aPTT: 40 seconds — ABNORMAL HIGH (ref 24–37)

## 2013-05-04 LAB — PROTIME-INR
INR: 1.06 (ref 0.00–1.49)
Prothrombin Time: 13.6 seconds (ref 11.6–15.2)

## 2013-05-04 LAB — ETHANOL: Alcohol, Ethyl (B): <11 mg/dL (ref 0–11)

## 2013-05-04 MED ORDER — SODIUM CHLORIDE 0.9 % IJ SOLN: 3.0000 mL | Freq: Two times a day (BID) | INTRAMUSCULAR | Status: DC

## 2013-05-04 MED ORDER — ATORVASTATIN CALCIUM 10 MG PO TABS
10.0000 mg | ORAL_TABLET | Freq: Every day | ORAL | Status: DC
  Filled 2013-05-04: qty 1

## 2013-05-04 MED ORDER — HEPARIN SODIUM (PORCINE) 5000 UNIT/ML IJ SOLN
5000.0000 [IU] | Freq: Three times a day (TID) | INTRAMUSCULAR | Status: DC
  Administered 2013-05-04 – 2013-05-05 (×2): 5000 [IU] via SUBCUTANEOUS
  Filled 2013-05-04 (×5): qty 1

## 2013-05-04 MED ORDER — SIMVASTATIN 20 MG PO TABS: 20.0000 mg | ORAL_TABLET | Freq: Every day | ORAL | Status: DC

## 2013-05-04 MED ORDER — CLOPIDOGREL BISULFATE 75 MG PO TABS
75.0000 mg | ORAL_TABLET | Freq: Every day | ORAL | Status: DC
  Administered 2013-05-04: 75 mg via ORAL
  Filled 2013-05-04 (×2): qty 1

## 2013-05-04 MED ORDER — SODIUM CHLORIDE 0.9 % IJ SOLN: 3.0000 mL | INTRAMUSCULAR | Status: DC | PRN

## 2013-05-04 MED ORDER — VERAPAMIL HCL 80 MG PO TABS
80.0000 mg | ORAL_TABLET | Freq: Once | ORAL | Status: AC
  Administered 2013-05-04: 80 mg via ORAL
  Filled 2013-05-04: qty 1

## 2013-05-04 MED ORDER — AMANTADINE HCL 100 MG PO CAPS
100.0000 mg | ORAL_CAPSULE | Freq: Every morning | ORAL | Status: DC
  Administered 2013-05-05: 100 mg via ORAL
  Filled 2013-05-04: qty 1

## 2013-05-04 MED ORDER — ADULT MULTIVITAMIN W/MINERALS CH
1.0000 | ORAL_TABLET | Freq: Every morning | ORAL | Status: DC
  Administered 2013-05-05: 1 via ORAL
  Filled 2013-05-04: qty 1

## 2013-05-04 MED ORDER — PNEUMOCOCCAL VAC POLYVALENT 25 MCG/0.5ML IJ INJ
0.5000 mL | INJECTION | INTRAMUSCULAR | Status: AC
  Administered 2013-05-05: 0.5 mL via INTRAMUSCULAR
  Filled 2013-05-04 (×2): qty 0.5

## 2013-05-04 MED ORDER — SODIUM CHLORIDE 0.9 % IV SOLN: 250.0000 mL | INTRAVENOUS | Status: DC | PRN

## 2013-05-04 MED ORDER — VERAPAMIL HCL 80 MG PO TABS
80.0000 mg | ORAL_TABLET | Freq: Three times a day (TID) | ORAL | Status: DC
  Administered 2013-05-04 – 2013-05-05 (×2): 80 mg via ORAL
  Filled 2013-05-04 (×4): qty 1

## 2013-05-04 MED ORDER — SODIUM CHLORIDE 0.9 % IV SOLN
INTRAVENOUS | Status: AC
  Administered 2013-05-04: 100 mL/h via INTRAVENOUS

## 2013-05-04 MED ORDER — LEVETIRACETAM 500 MG PO TABS
1000.0000 mg | ORAL_TABLET | Freq: Two times a day (BID) | ORAL | Status: DC
  Administered 2013-05-04 – 2013-05-05 (×2): 1000 mg via ORAL
  Filled 2013-05-04 (×3): qty 2
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
## 2013-05-04 NOTE — ED Notes (Signed)
Pt now denies numbness to the right side and c/o jittery feeling (pt sliding right leg from left to right across the bed). Pt also states, "it feels like the blood in my brain is moving from one side to the other."

## 2013-05-04 NOTE — H&P (Signed)
[facility] History and Physical  Daiwik Buffalo NFA:[id number] DOB: 13-May-1961 DOA: 05/04/2013  Referring physician: Dr.Mccmanus PCP: Alva Garnet., MD  Specialists: none  Chief Complaint: TIA  HPI: Abdoulie Tierce is a 52 y.o. 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 He relates intermittent headaches that started this morning. No change in vision ,No changes in his medication.  In the ED:  An MRI was done that was negative for a stroke, emergency room physician called the neurologist to see this patient needed to be admitted under neurologist recommended the patient to be admitted and repeat the workup for stroke.  Review of Systems: The patient denies anorexia, fever, weight loss,, vision loss, decreased hearing, hoarseness, chest pain, syncope, dyspnea on exertion, peripheral edema, balance deficits, hemoptysis, abdominal pain, melena, hematochezia, severe indigestion/heartburn, hematuria, incontinence, genital sores, muscle weakness, suspicious skin lesions, transient blindness,  depression, unusual weight change, abnormal bleeding, enlarged lymph nodes, angioedema, and breast masses.    Past Medical History  Diagnosis Date  . Seizures   . Hypertension   . Stroke    History reviewed. No pertinent past surgical history. Social History:  reports that he has quit smoking. His smoking use included Cigarettes. He smoked 0.00 packs per day. He does not have any smokeless tobacco history on file. He reports that he does not drink alcohol or use illicit drugs.   No Known Allergies  History reviewed. No pertinent family history. he relates his mom and his dad had  diabetes his dad had hypertension.  Prior to Admission medications   Medication Sig Start Date End Date Taking? Authorizing Provider  amantadine (SYMMETREL) 100 MG capsule Take 100 mg by mouth every morning.   Yes Historical Provider, MD  aspirin EC 81 MG tablet Take 81 mg by mouth every morning.    Yes Historical Provider, MD  levETIRAcetam (KEPPRA) 1000 MG tablet Take 1,000 mg by mouth 2 (two) times daily.   Yes Historical Provider, MD  Multiple Vitamin (MULTIVITAMIN WITH MINERALS) TABS Take 1 tablet by mouth every morning.   Yes Historical Provider, MD  pravastatin (PRAVACHOL) 20 MG tablet Take 20 mg by mouth every morning.   Yes Historical Provider, MD  verapamil (CALAN) 80 MG tablet Take 80 mg by mouth 3 (three) times daily.     Yes Historical Provider, MD   Physical Exam: Filed Vitals:   05/04/13 1056 05/04/13 1121 05/04/13 1433 05/04/13 1529  BP: 134/81 129/85 142/82 116/84  Pulse: 68 84 67 64  Temp: 97.9 F (36.6 C) 97.7 F (36.5 C)    TempSrc: Oral Oral    Resp: 18 16 16 16   Height: 5' 7.5" (1.715 m)     Weight: 76.204 kg (168 lb)     SpO2: 100% 97% 100% 99%    BP 116/84  Pulse 64  Temp(Src) 97.7 F (36.5 C) (Oral)  Resp 16  Ht 5' 7.5" (1.715 m)  Wt 76.204 kg (168 lb)  BMI 25.91 kg/m2  SpO2 99%  General Appearance:    Alert, cooperative, no distress, appears stated age  Head:    Normocephalic, without obvious abnormality, atraumatic           Throat:   Lips, mucosa, and  tongue normal; teeth and gums normal  Neck:   Supple, symmetrical, trachea midline, no adenopathy;       thyroid:  No enlargement/tenderness/nodules; no carotid   bruit or JVD  Back:     Symmetric, no curvature, ROM normal, no CVA tenderness  Lungs:     Clear to auscultation bilaterally, respirations unlabored  Chest wall:    No tenderness or deformity  Heart:    Regular rate and rhythm, S1 and S2 normal, no murmur, rub   or gallop           Extremities:   Extremities normal, atraumatic, no  cyanosis or edema  Pulses:   2+ and symmetric all extremities  Skin:   Skin color, texture, turgor normal, no rashes or lesions  Lymph nodes:   Cervical, supraclavicular, and axillary nodes normal  Neurologic:   CNII-XII intact. Normal strength in all 4 extremities except in the right lower extremity which is 4-5 is wearing a brace., sensation and reflexes  throughout     Labs on Admission:  Basic Metabolic Panel:  Recent Labs Lab 05/04/13 1139  NA 136  K 3.8  CL 99  CO2 27  GLUCOSE 90  BUN 16  CREATININE 1.06  CALCIUM 10.1   Liver Function Tests:  Recent Labs Lab 05/04/13 1139  AST 24  ALT 20  ALKPHOS 74  BILITOT 0.9  PROT 7.8  ALBUMIN 4.0   No results found for this basename: LIPASE, AMYLASE,  in the last 168 hours No results found for this basename: AMMONIA,  in the last 168 hours CBC:  Recent Labs Lab 05/04/13 1139  WBC 9.1  HGB 13.8  HCT 36.7*  MCV 80.7  PLT 108*   Cardiac Enzymes: No results found for this basename: CKTOTAL, CKMB, CKMBINDEX, TROPONINI,  in the last 168 hours  BNP (last 3 results) No results found for this basename: PROBNP,  in the last 8760 hours CBG: No results found for this basename: GLUCAP,  in the last 168 hours  Radiological Exams on Admission: Dg Chest 2 View  05/04/2013   *RADIOLOGY REPORT*  Clinical Data: Right-sided weakness  CHEST - 2 VIEW  Comparison: 06/20/2011  Findings: Normal heart size.  Lungs are under aerated with bibasilar atelectasis.  No pleural effusion.  No pneumothorax.  IMPRESSION: No active cardiopulmonary disease.   Original Report Authenticated By: Jolaine Click, M.D.   Ct Head Wo Contrast  05/04/2013   *RADIOLOGY REPORT*  Clinical Data: Right-sided weakness  CT HEAD WITHOUT CONTRAST  Technique:  Contiguous axial images were obtained from the base of the skull through the vertex without contrast.  Comparison: 06/20/2011.  Brain MR performed today.  Findings: Encephalomalacia in the left frontal lobe towards  the vertex is again present.  There is increasing height purred density centrally within the area of encephalomalacia.  The accompanying MRI fail to demonstrate increasing hemorrhage and this is favored represent increasing calcification within an old infarct.  Mild global atrophy.  Chronic ischemic changes in the periventricular white matter are superimposed.  No midline shift or mass effect. Mastoid air cells are clear.  Visualized paranasal sinuses are clear.  Cranium is intact.  IMPRESSION: Chronic ischemic changes as described.  Increasing density within the left frontal lobe encephalomalacia is favored represent increasing calcification within a previous hemorrhagic infarct.   Original Report Authenticated By: Jolaine Click, M.D.   Mr Brain Wo Contrast  05/04/2013   *RADIOLOGY REPORT*  Clinical Data: Right-sided weakness and tingling.  Old stroke.  MRI HEAD WITHOUT CONTRAST  Technique:  Multiplanar, multiecho pulse sequences of the brain and surrounding structures were obtained according to standard protocol without intravenous contrast.  Comparison: 06/20/2011.  11/20/2010.  Findings: Diffusion imaging does not show any acute or subacute infarction.  The brainstem and cerebellum are normal.  There is a background pattern of moderate chronic small vessel disease throughout the cerebral hemispheric white matter.  There is an old hemorrhagic infarction in the left posterior frontal cortical and subcortical region which has progressed atrophy encephalomalacia with gliosis and residual hemosiderin.  No sign of acute infarction, mass lesion, acute hemorrhage, hydrocephalus or extra- axial collection.  No pituitary mass.  No inflammatory sinus disease.  IMPRESSION: No acute finding.  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  Background pattern of moderate chronic small vessel disease throughout the cerebral hemispheric white matter.   Original Report Authenticated By: Paulina Fusi, M.D.     EKG: Independently reviewed. Normal sinus rhythm T wave inversions in the inferior leads no reciprocal changes. Normal axis.  Assessment/Plan  TIA (transient ischemic attack) in a patient with  History of hemorrhagic stroke with residual hemiparesis: - HgbA1c, fasting lipid panel  - MRI negative for stroke, MRA of the brain without contrast. - PT consult, OT consult, Speech consult  - Echocardiogram  - Carotid dopplers  - Prophylactic therapy-Antiplatelet med: plavix 75mg  - Avoid D5 fluids as may be harmfull - risk factor modification  - Cardiac Monitoring  - Neurochecks q4h  - Keep MAP 70 - tobacco counseling cessation. - UDS negative.  Hypertension: - Resume home dose verapamil.  History of drug abuse:  - UDS negative.  Thrombocytopenia: - Renal function were normal limits. He has no petechia is a rashes. No recent fevers or upper respiratory tract infections repeat a CBC in the morning. - Continue to monitor.  Code Status: full Family Communication: cousin Disposition Plan: observation  Time spent: 70 minutes  Marinda Elk [facility] Pager [phone]  If 7PM-7AM, please contact night-coverage [email or web address] Password [facility] 05/04/2013, 6:01 PM

## 2013-05-04 NOTE — ED Provider Notes (Signed)
History    CSN: [id number] Arrival date & time 05/04/13  1029  First MD Initiated Contact with Patient 05/04/13 1112     Chief Complaint  Patient presents with  . right arm tingling   . right leg weakness    (Consider location/radiation/quality/duration/timing/severity/associated sxs/prior Treatment) HPI Pt is a 52yo male with hx of seizures and stroke (2012), presented today after episode of right sided UE and LE weakness and tingling while sitting on the city bus earlier today.  Pt states he felt as though the blood was going from one side of his body to the other prior to onset of weakness. Pt also c/o intermittent headaches for the past few days.  Described as "blood going from one side of my head to the other, it doesn't really hurt."  Turning his head from side to side makes symptoms worse.  Pt also reports dental work 1 week ago for dental abscess for which he had a tooth removed and was placed on antibiotics (cannot recall name of medication).  Denies fever, n/v/d. Denies chest pain or SOB. Denies symptoms at this time. Denies recent use of alcohol or street drugs including cocaine.  States he last tried it 48yrs ago.  Reports taking medication as prescribed, no new medications (besides antibiotic last week). No change in dosing of medications. Denies being on blood thinners. Pt is seen by [facility] and [facility], PCP.    Past Medical History  Diagnosis Date  . Seizures   . Hypertension   . Stroke    History reviewed. No pertinent past surgical history. History reviewed. No pertinent family history. History  Substance Use Topics  . Smoking status: Former Smoker    Types: Cigarettes  . Smokeless tobacco: Not on file  . Alcohol Use: No    Review of Systems  Constitutional: Negative for fever, chills and fatigue.  Eyes: Negative for photophobia and visual disturbance.  Respiratory: Negative for cough and shortness of breath.    Cardiovascular: Negative for chest pain.  Gastrointestinal: Negative for nausea, vomiting and diarrhea.  Neurological: Positive for dizziness, weakness, numbness and headaches. Negative for tremors, seizures, syncope, facial asymmetry, speech difficulty and light-headedness.  All other systems reviewed and are negative.    Allergies  Review of patient's allergies indicates no known allergies.  Home Medications   No current outpatient prescriptions on file. BP 131/77  Pulse 60  Temp(Src) 98 F (36.7 C) (Oral)  Resp 16  Ht 5\' 7"  (1.702 m)  Wt 168 lb (76.204 kg)  BMI 26.31 kg/m2  SpO2 100% Physical Exam  Nursing note and vitals reviewed. Constitutional: He is oriented to person, place, and time. He appears well-developed and well-nourished. No distress.  Pt lying comfortably in exam bed, seizure pads on bed rails.  Leg brace visible on pt's right leg, cane leaning against wall.  HENT:  Head: Normocephalic and atraumatic.  Mouth/Throat: Oropharynx is clear and moist. No oropharyngeal exudate.  Eyes: Conjunctivae and EOM are normal. Pupils are equal, round, and reactive to light. Right eye exhibits no discharge. Left eye exhibits no discharge. No scleral icterus.  Neck: Normal range of motion. Neck supple.  No nuchal rigidity or meningeal signs.    Cardiovascular: Normal rate, regular rhythm and normal heart sounds.   Pulmonary/Chest: Effort normal and breath sounds normal. No respiratory distress. He has no wheezes. He has no rales. He exhibits no tenderness.  Abdominal: Soft. Bowel sounds are normal. He exhibits no distension and  no mass. There is no tenderness. There is no rebound and no guarding.  Musculoskeletal: He exhibits no edema and no tenderness.  Neurological: He is alert and oriented to person, place, and time. He displays no atrophy and no tremor. No cranial nerve deficit. He exhibits normal muscle tone. He displays no seizure activity. Coordination normal. GCS eye  subscore is 4. GCS verbal subscore is 5. GCS motor subscore is 6.  CN II-XII in tact. Pt A&Ox4. 5/5 strength in left UE and LE. 5/5 strength in right upper extremity. 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 Nl sensation left UE and LE.  Nl sensation in right UE, possible decreased sensation in right LE (pt "unsure"). Pt ambulates with cane.   Skin: Skin is warm and dry. He is not diaphoretic.  Psychiatric: He has a normal mood and affect. His behavior is normal.    ED Course  Procedures (including critical care time) Labs Reviewed  CBC - Abnormal; Notable for the following:    HCT 36.7 (*)    MCHC 37.0 (*)    RDW 18.1 (*)    Platelets 108 (*)    All other components within normal limits  COMPREHENSIVE METABOLIC PANEL - Abnormal; Notable for the following:    GFR calc non Af Amer 79 (*)    All other components within normal limits  APTT - Abnormal; Notable for the following:    aPTT 40 (*)    All other components within normal limits  URINE RAPID DRUG SCREEN (HOSP PERFORMED)  ETHANOL  URINALYSIS, ROUTINE W REFLEX MICROSCOPIC  PROTIME-INR  HEMOGLOBIN A1C  LIPID PANEL  URINE RAPID DRUG SCREEN (HOSP PERFORMED)  CBC  CREATININE, SERUM  POCT I-STAT TROPONIN I   Dg Chest 2 View  05/04/2013   *RADIOLOGY REPORT*  Clinical Data: Right-sided weakness  CHEST - 2 VIEW  Comparison: 06/20/2011  Findings: Normal heart size.  Lungs are under aerated with bibasilar atelectasis.  No pleural effusion.  No pneumothorax.  IMPRESSION: No active cardiopulmonary disease.   Original Report Authenticated By: Jolaine Click, M.D.   Ct Head Wo Contrast  05/04/2013   *RADIOLOGY REPORT*  Clinical Data: Right-sided weakness  CT HEAD WITHOUT CONTRAST  Technique:  Contiguous axial images were obtained from the base of the skull through the vertex without contrast.  Comparison: 06/20/2011.  Brain MR performed today.  Findings: Encephalomalacia in the left frontal lobe towards the  vertex is again present.  There is increasing height purred density centrally within the area of encephalomalacia.  The accompanying MRI fail to demonstrate increasing hemorrhage and this is favored represent increasing calcification within an old infarct.  Mild global atrophy.  Chronic ischemic changes in the periventricular white matter are superimposed.  No midline shift or mass effect. Mastoid air cells are clear.  Visualized paranasal sinuses are clear.  Cranium is intact.  IMPRESSION: Chronic ischemic changes as described.  Increasing density within the left frontal lobe encephalomalacia is favored represent increasing calcification within a previous hemorrhagic infarct.   Original Report Authenticated By: Jolaine Click, M.D.   Mr Brain Wo Contrast  05/04/2013   *RADIOLOGY REPORT*  Clinical Data: Right-sided weakness and tingling.  Old stroke.  MRI HEAD WITHOUT CONTRAST  Technique:  Multiplanar, multiecho pulse sequences of the brain and surrounding structures were obtained according to standard protocol without intravenous contrast.  Comparison: 06/20/2011.  11/20/2010.  Findings: Diffusion imaging does not show any acute or subacute infarction.  The brainstem and cerebellum  are normal.  There is a background pattern of moderate chronic small vessel disease throughout the cerebral hemispheric white matter.  There is an old hemorrhagic infarction in the left posterior frontal cortical and subcortical region which has progressed atrophy encephalomalacia with gliosis and residual hemosiderin.  No sign of acute infarction, mass lesion, acute hemorrhage, hydrocephalus or extra- axial collection.  No pituitary mass.  No inflammatory sinus disease.  IMPRESSION: No acute finding.  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  Background pattern of moderate chronic small vessel disease throughout the cerebral hemispheric white matter.   Original Report Authenticated By: Paulina Fusi, M.D.     Date: 05/04/2013  Rate: 70  Rhythm: normal sinus rhythm  QRS Axis: normal  Intervals: normal  ST/T Wave abnormalities: nonspecific T wave changes and inverted T waves in inferior leads  Conduction Disutrbances:none  Narrative Interpretation: new T-wave changes, concern for possible MI  Old EKG Reviewed: changes noted and inverted T-waves in inferior leads    1. TIA (transient ischemic attack)   2. History of drug abuse   3. Hypertension   4. Thrombocytopenia     MDM  Pt has hx of seizures and stroke, along with concerning episode of right sided weakness and tingling, will work pt up as possible stroke.  Pt is not on blood thinners.  Previous imaging: CT head 06/20/11-showed mild hydrocephalus since previous imaging,  MRA head 11/20/10- Moderate stenosis distal right vertebral artery. This puts pt at an increased risk for repeat stroke.   7:57 PM Labs and imaging are unremarkable at this time, however discussed pt with Dr. Clarene Duke who believes pt should be admitted for possible TIA due to PMH and symptoms experienced today.  Pt is to be admitted for observation.       Junius Finner, PA-C 05/04/13 1958

## 2013-05-04 NOTE — ED Notes (Signed)
Patient transported to MRI 

## 2013-05-05 ENCOUNTER — Observation Stay (HOSPITAL_COMMUNITY): Payer: Medicaid Other

## 2013-05-05 DIAGNOSIS — I517 Cardiomegaly: Secondary | ICD-10-CM

## 2013-05-05 DIAGNOSIS — G459 Transient cerebral ischemic attack, unspecified: Secondary | ICD-10-CM

## 2013-05-05 LAB — LIPID PANEL
Cholesterol: 124 mg/dL (ref 0–200)
Total CHOL/HDL Ratio: 2.9 RATIO

## 2013-05-05 LAB — GLUCOSE, CAPILLARY: Glucose-Capillary: 103 mg/dL — ABNORMAL HIGH (ref 70–99)

## 2013-05-05 LAB — RAPID URINE DRUG SCREEN, HOSP PERFORMED
Barbiturates: NOT DETECTED
Benzodiazepines: NOT DETECTED
Cocaine: NOT DETECTED
Tetrahydrocannabinol: NOT DETECTED

## 2013-05-05 LAB — HEMOGLOBIN A1C: Mean Plasma Glucose: 82 mg/dL (ref <117)

## 2013-05-05 MED ORDER — CLOPIDOGREL BISULFATE 75 MG PO TABS: 75.0000 mg | ORAL_TABLET | Freq: Every day | ORAL | Status: DC

## 2013-05-05 NOTE — Progress Notes (Signed)
VASCULAR LAB PRELIMINARY  PRELIMINARY  PRELIMINARY  PRELIMINARY  Carotid Dopplers completed.    Preliminary report:  There is 0-39% ICA stenosis.  Vertebral artery flow is antegrade.  Selyna Klahn, RVT 05/05/2013, 11:05 AM

## 2013-05-05 NOTE — Progress Notes (Signed)
Echo Lab  2D Echocardiogram completed.  Eugene [name] L [name], RDCS 05/05/2013 10:00 AM

## 2013-05-05 NOTE — Discharge Summary (Signed)
Physician Discharge Summary  Master Touchet ZOX:[id number] DOB: Apr 19, 1961 DOA: 05/04/2013  PCP: Alva Garnet., MD  Admit date: 05/04/2013 Discharge date: 05/05/2013  Time spent: 35 minutes  Recommendations for Outpatient Follow-up:  1. Follow up with Neurologist in 2 weeks.  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 He relates intermittent headaches that started this morning. No change in vision ,No changes in his medication.   Hospital Course:  TIA (transient ischemic attack) in a patient with History of hemorrhagic stroke with residual hemiparesis:  - HgbA1c 4.5 , fasting lipid panel  HDL >40, LDL < 100 - MRI negative for stroke, MRA no abnormalities. - PT consult, OT as outpatient. - Carotid dopplers no ICA stenosis - Prophylactic therapy-Antiplatelet med: plavix 75mg   - Cardiac Monitoring no events, EKG no a.fib. - UDS negative.  - follow up with neurologist.  Hypertension:  - Resume home dose verapamil.   History of drug abuse:  - UDS negative.   Thrombocytopenia:  - Renal function were normal limits. He has no petechia is a rashes. No recent fevers or upper respiratory tract infections repeat a CBC in the morning.  - Continue to monitor.   Procedures:  MRI/MRA  Carotid  doppler  Consultations:  none  Discharge Exam: Filed Vitals:   05/05/13 0201 05/05/13 0400 05/05/13 0522 05/05/13 1025  BP: 113/72  116/76 121/73  Pulse: 57  72   Temp: 97.7 F (36.5 C) 98.2 F (36.8 C)    TempSrc: Oral     Resp: 16  16   Height:      Weight:      SpO2: 96%  100%     General: A&O x3 Cardiovascular: RRR Respiratory: good air movement CTA B/L  Discharge Instructions  Discharge Orders   Future Appointments Provider Department Dept Phone   05/16/2013 11:00 AM Timoteo Gaul, PT Outpt Rehabilitation [facility] [phone]   Future Orders Complete By Expires     Diet - low sodium heart healthy  As directed     Increase activity slowly  As directed         Medication List    STOP taking these medications       aspirin EC 81 MG tablet      TAKE these medications       amantadine 100 MG capsule  Commonly known as:  SYMMETREL  Take 100 mg by mouth every morning.     clopidogrel 75 MG tablet  Commonly known as:  PLAVIX  Take 1 tablet (75 mg total) by mouth daily with breakfast.     levETIRAcetam 1000 MG tablet  Commonly known as:  KEPPRA  Take 1,000 mg by mouth 2 (two) times daily.     multivitamin with minerals Tabs  Take 1 tablet by mouth every morning.  pravastatin 20 MG tablet  Commonly known as:  PRAVACHOL  Take 20 mg by mouth every morning.     verapamil 80 MG tablet  Commonly known as:  CALAN  Take 80 mg by mouth 3 (three) times daily.       No Known Allergies     Follow-up Information   Follow up with PENUMALLI,VIKRAM, MD In 2 weeks. (hosital follow up)    Contact information:   [address] [place] [address] [phone]        The results of significant diagnostics from this hospitalization (including imaging, microbiology, ancillary and laboratory) are listed below for reference.    Significant Diagnostic Studies: Dg Chest 2 View  05/04/2013   *RADIOLOGY REPORT*   Clinical Data: Right-sided weakness  CHEST - 2 VIEW  Comparison: 06/20/2011  Findings: Normal heart size.  Lungs are under aerated with bibasilar atelectasis.  No pleural effusion.  No pneumothorax.  IMPRESSION: No active cardiopulmonary disease.   Original Report Authenticated By: Jolaine Click, M.D.   Ct Head Wo Contrast  05/04/2013   *RADIOLOGY REPORT*  Clinical Data: Right-sided weakness  CT HEAD WITHOUT CONTRAST  Technique:  Contiguous axial images were obtained from the base of the skull through the vertex without contrast.  Comparison: 06/20/2011.  Brain MR performed today.  Findings: Encephalomalacia in the left frontal lobe towards the vertex is again present.  There is increasing height purred density centrally within the area of encephalomalacia.  The accompanying MRI fail to demonstrate increasing hemorrhage and this is favored represent increasing calcification within an old infarct.  Mild global atrophy.  Chronic ischemic changes in the periventricular white matter are superimposed.  No midline shift or mass effect. Mastoid air cells are clear.  Visualized paranasal sinuses are clear.  Cranium is intact.  IMPRESSION: Chronic ischemic changes as described.  Increasing density within the left frontal lobe encephalomalacia is favored represent increasing calcification within a previous hemorrhagic infarct.   Original Report Authenticated By: Jolaine Click, M.D.   Mr [facility] Wo Contrast  05/05/2013   *RADIOLOGY REPORT*  Clinical Data: TIA  MRA HEAD WITHOUT CONTRAST  Technique: Angiographic images of the Circle of Willis were obtained using MRA technique without intravenous contrast.  Comparison: MRI 06/28  Findings: Both internal carotid arteries are widely patent into the brain.  The anterior and middle cerebral vessels are patent without proximal stenosis, aneurysm or vascular malformation.  Both vertebral arteries are patent to the basilar.  No basilar stenosis. Posterior circulation branch vessels  appear normal.  IMPRESSION: Normal intracranial MR angiography of the large and medium-sized vessels.   Original Report Authenticated By: Paulina Fusi, M.D.   Mr Brain Wo Contrast  05/04/2013   *RADIOLOGY REPORT*  Clinical Data: Right-sided weakness and tingling.  Old stroke.  MRI HEAD WITHOUT CONTRAST  Technique:  Multiplanar, multiecho pulse sequences of the brain and surrounding structures were obtained according to standard protocol without intravenous contrast.  Comparison: 06/20/2011.  11/20/2010.  Findings: Diffusion imaging does not show any acute or subacute infarction.  The brainstem and cerebellum are normal.  There is a background pattern of moderate chronic small vessel disease throughout the cerebral hemispheric white matter.  There is an old hemorrhagic infarction in the left posterior frontal cortical and subcortical region which has progressed atrophy encephalomalacia with gliosis and residual hemosiderin.  No sign of acute infarction, mass lesion, acute hemorrhage, hydrocephalus or extra- axial collection.  No pituitary mass.  No inflammatory sinus disease.  IMPRESSION:  No acute finding.  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  Background pattern of moderate chronic small vessel disease throughout the cerebral hemispheric white matter.   Original Report Authenticated By: Paulina Fusi, M.D.    Microbiology: No results found for this or any previous visit (from the past 240 hour(s)).   Labs: Basic Metabolic Panel:  Recent Labs Lab 05/04/13 1139  NA 136  K 3.8  CL 99  CO2 27  GLUCOSE 90  BUN 16  CREATININE 1.06  CALCIUM 10.1   Liver Function Tests:  Recent Labs Lab 05/04/13 1139  AST 24  ALT 20  ALKPHOS 74  BILITOT 0.9  PROT 7.8  ALBUMIN 4.0   No results found for this basename: LIPASE, AMYLASE,  in the last 168 hours No results found for this basename: AMMONIA,  in the last 168 hours CBC:  Recent Labs Lab 05/04/13 1139  WBC 9.1  HGB  13.8  HCT 36.7*  MCV 80.7  PLT 108*   Cardiac Enzymes: No results found for this basename: CKTOTAL, CKMB, CKMBINDEX, TROPONINI,  in the last 168 hours BNP: BNP (last 3 results) No results found for this basename: PROBNP,  in the last 8760 hours CBG:  Recent Labs Lab 05/04/13 2117 05/05/13 0753  GLUCAP 95 92       Signed:  FELIZ ORTIZ, ABRAHAM  [facility] 05/05/2013, 11:10 AM

## 2013-05-07 ENCOUNTER — Telehealth: Payer: Self-pay | Admitting: Diagnostic Neuroimaging

## 2013-05-07 NOTE — ED Provider Notes (Signed)
Medical screening examination/treatment/procedure(s) were conducted as a shared visit with non-physician practitioner(s) and myself.  I personally evaluated the patient during the encounter Please see my previous note.  Laray Anger, DO 05/07/13 1258

## 2013-05-07 NOTE — Telephone Encounter (Signed)
Spoke to patient. Scheduled 2 week f/u per ED notes.

## 2013-05-16 ENCOUNTER — Ambulatory Visit: Payer: Medicare Other | Attending: Internal Medicine | Admitting: Physical Therapy

## 2013-05-16 DIAGNOSIS — R269 Unspecified abnormalities of gait and mobility: Secondary | ICD-10-CM | POA: Insufficient documentation

## 2013-05-16 DIAGNOSIS — IMO0001 Reserved for inherently not codable concepts without codable children: Secondary | ICD-10-CM | POA: Insufficient documentation

## 2013-05-16 DIAGNOSIS — M6281 Muscle weakness (generalized): Secondary | ICD-10-CM | POA: Insufficient documentation

## 2013-05-23 ENCOUNTER — Ambulatory Visit: Payer: Medicare Other | Admitting: Physical Therapy

## 2013-05-23 ENCOUNTER — Ambulatory Visit: Payer: Medicare Other | Admitting: Occupational Therapy

## 2013-05-24 ENCOUNTER — Ambulatory Visit (INDEPENDENT_AMBULATORY_CARE_PROVIDER_SITE_OTHER): Payer: Medicare Other | Admitting: Diagnostic Neuroimaging

## 2013-05-24 ENCOUNTER — Encounter: Payer: Self-pay | Admitting: Diagnostic Neuroimaging

## 2013-05-24 ENCOUNTER — Ambulatory Visit: Payer: Medicare Other | Admitting: Physical Therapy

## 2013-05-24 ENCOUNTER — Ambulatory Visit: Payer: Medicare Other | Admitting: Occupational Therapy

## 2013-05-24 VITALS — BP 124/78 | HR 67 | Ht 69.0 in | Wt 169.0 lb

## 2013-05-24 DIAGNOSIS — G459 Transient cerebral ischemic attack, unspecified: Secondary | ICD-10-CM

## 2013-05-24 DIAGNOSIS — R569 Unspecified convulsions: Secondary | ICD-10-CM

## 2013-05-24 DIAGNOSIS — Z8673 Personal history of transient ischemic attack (TIA), and cerebral infarction without residual deficits: Secondary | ICD-10-CM

## 2013-05-24 DIAGNOSIS — I69359 Hemiplegia and hemiparesis following cerebral infarction affecting unspecified side: Secondary | ICD-10-CM

## 2013-05-24 MED ORDER — LEVETIRACETAM 750 MG PO TABS: 1500.0000 mg | ORAL_TABLET | Freq: Two times a day (BID) | ORAL | Status: DC

## 2013-05-24 NOTE — Patient Instructions (Signed)
Stop plavix (due to side effects of bruising and dizziness). Start aspirin 81mg  daily instead.  Change levetiracetam to 1500mg  twice a day.

## 2013-05-24 NOTE — Progress Notes (Signed)
[facility]  PATIENT: Eugene [name] DOB: [birth year]  REFERRING CLINICIAN: ER HISTORY FROM: patient  REASON FOR VISIT: follow up   HISTORICAL  CHIEF COMPLAINT:  Chief Complaint  Patient presents with  . Follow-up    RV #[id number]     HISTORY OF PRESENT ILLNESS:   UPDATE 05/24/13: Since last visit, patient had one visit to emergency room for possible seizure versus stroke. Currently patient has been having dental problems, headache 2 distraction was placed on antibiotics pain medication. One week later, patient was on the bus and had fairly sudden onset feeling as though he might have a partial seizure. Previous partial seizures involve convulsions on the right arm and right leg. Patient sat on the bus but did not go into convulsions. When he stood up he felt weakness in his right leg. Patient went to the hospital for further valuation. He was admitted under TIA protocol. MRI of the brain was done which showed no acute findings. Patient was discharged on Plavix instead of aspirin.  Since that time patient has done well. No further events. He does feel dizziness and has been seeing dark spots, since starting Plavix. He has not missed a seizure medications.  UPDATE 01/14/13: Doing well. Had 1 breakthrogh sz in Aug 2013, when he missed meds. Otherwise doing well.   UPDATE 02/01/12: Had another breakthrough sz (right arm/leg convulsions) in Feb 2013. Had arguement with daughter that evening by phone. Then had sz at 4am, when he woke up. No missed doses. No other factors.  PRIOR HPI: 52 year old right-handed male with history of intracerebral hemorrhage, here for evaluation of syncope versus seizure.  The patient was admitted to [facility] in January 2012 for left frontal intracerebral hemorrhage with right upper and lower extremity weakness.  He was discharged to rehabilitation in [facility].  He was doing well with improvement of right-sided weakness.  On  June 20, 2011, he was in the bathroom, shaving, when he developed lightheadedness, called to his sister for help, and fell to the ground. When she came to the bathroom she found him shaking for approximately one to 2 minutes. He was profusely sweating. No tongue biting or incontinence. After the episode, patient had significant right upper and lower extremity weakness.  He was somewhat confused for several hours after the event. He went to the emergency room, had CT scan of the head and was discharged with a diagnosis of syncope.  Since that time he has had no further events.  REVIEW OF SYSTEMS: Full 14 system review of systems performed and notable only for seizure vs TIA.   ALLERGIES: No Known Allergies  HOME MEDICATIONS: Outpatient Prescriptions Prior to Visit  Medication Sig Dispense Refill  . amantadine (SYMMETREL) 100 MG capsule Take 100 mg by mouth every morning.      . clopidogrel (PLAVIX) 75 MG tablet Take 1 tablet (75 mg total) by mouth daily with breakfast.  30 tablet  0  . Multiple Vitamin (MULTIVITAMIN WITH MINERALS) TABS Take 1 tablet by mouth every morning.      . pravastatin (PRAVACHOL) 20 MG tablet Take 20 mg by mouth every morning.      . verapamil (CALAN) 80 MG tablet Take 80 mg by mouth 3 (three) times daily.        Marland Kitchen levETIRAcetam (KEPPRA) 1000 MG tablet Take 1,000 mg by mouth 2 (two) times daily.       No facility-administered medications prior to visit.  PAST MEDICAL HISTORY: Past Medical History  Diagnosis Date  . Seizures   . Hypertension   . Stroke     PAST SURGICAL HISTORY: No past surgical history on file.  FAMILY HISTORY: No family history on file.  SOCIAL HISTORY:  History   Social History  . Marital Status: Single    Spouse Name: N/A    Number of Children: N/A  . Years of Education: N/A   Occupational History  . Not on file.   Social History Main Topics  . Smoking status: Former Smoker -- 0.50 packs/day for 20 years    Types:  Cigarettes  . Smokeless tobacco: Never Used  . Alcohol Use: No  . Drug Use: No  . Sexually Active: Not Currently   Other Topics Concern  . Not on file   Social History Narrative  . No narrative on file     PHYSICAL EXAM  Filed Vitals:   05/24/13 1122 05/24/13 1126 05/24/13 1128  BP: 116/74 122/77 124/78  Pulse: 67 65 67  Height: 5\' 9"  (1.753 m)    Weight: 169 lb (76.658 kg)      Not recorded    Body mass index is 24.95 kg/(m^2).  GENERAL EXAM: Patient is in no distress  CARDIOVASCULAR: Regular rate and rhythm, no murmurs, no carotid bruits  NEUROLOGIC: MENTAL STATUS: awake, alert, language fluent, comprehension intact, naming intact CRANIAL NERVE: no papilledema on fundoscopic exam, pupils equal and reactive to light, visual fields full to confrontation, extraocular muscles intact, no nystagmus, facial sensation symmetric, DECR RIGHT LOWER FACIAL STRENGTH, uvula midline, shoulder shrug symmetric, tongue midline. MOTOR: normal bulk and tone, full strength in the LUE AND LLE. RUE TRICEPS 4, RLE (HF 4, KE 4, KF 3, DF 2). SENSORY: normal and symmetric to light touch COORDINATION: finger-nose-finger, fine finger movements normal REFLEXES: deep tendon reflexes present and INCREASED ON RIGHT SIDE GAIT/STATION: MILD RIGHT HEMIPARETIC GAIT   DIAGNOSTIC DATA (LABS, IMAGING, TESTING) - I reviewed patient records, labs, notes, testing and imaging myself where available.  Lab Results  Component Value Date   WBC 9.1 05/04/2013   HGB 13.8 05/04/2013   HCT 36.7* 05/04/2013   MCV 80.7 05/04/2013   PLT 108* 05/04/2013      Component Value Date/Time   NA 136 05/04/2013 1139   K 3.8 05/04/2013 1139   CL 99 05/04/2013 1139   CO2 27 05/04/2013 1139   GLUCOSE 90 05/04/2013 1139   BUN 16 05/04/2013 1139   CREATININE 1.06 05/04/2013 1139   CALCIUM 10.1 05/04/2013 1139   PROT 7.8 05/04/2013 1139   ALBUMIN 4.0 05/04/2013 1139   AST 24 05/04/2013 1139   ALT 20 05/04/2013 1139   ALKPHOS 74  05/04/2013 1139   BILITOT 0.9 05/04/2013 1139   GFRNONAA 79* 05/04/2013 1139   GFRAA >90 05/04/2013 1139   Lab Results  Component Value Date   CHOL 124 05/05/2013   HDL 43 05/05/2013   LDLCALC 67 05/05/2013   TRIG 72 05/05/2013   CHOLHDL 2.9 05/05/2013   Lab Results  Component Value Date   HGBA1C 4.5 05/04/2013   No results found for this basename: VITAMINB12   No results found for this basename: TSH    05/04/13 MRI brain (without) - No acute finding. Old hemorrhagic infarction in the left posterior  frontal region. Background pattern of moderate chronic small  vessel disease throughout the cerebral hemispheric white matter.  05/05/13 MRA head - normal  05/06/13 carotid u/s - Bilateral: intimal wall thickening  CCA. Mild soft plaque origin ICA. 0-39% ICA stenosis. vertebral artery flow is antegrade. ICA/CCA ratio: R-1.39 L-1.0  05/05/13 TTE - No prior study for comparison. Mild basal septal hypertrophy with LVEF 55-60%, mild diastolic dysfunction. No clear evidence of ASD or PFO.   ASSESSMENT AND PLAN  52 y.o. 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 Now with episode of loss of consciousness, convulsions, postictal right-sided weakness, postictal confusion on June 20, 2011. Another partial sz in Feb 2013 and Aug 2013. Recent event in June 2014 may have been partial seizure, seizure aura or TIA.  PLAN: 1. Increase LEV to 1500mg  BID 2. safety and seizure precautions reviewed 3. Change plavix back to aspirin 81mg  daily (due to side effects: dizziness, vision changes, bruising)   Meds ordered this encounter  Medications  . levETIRAcetam (KEPPRA) 750 MG tablet    Sig: Take 2 tablets (1,500 mg total) by mouth 2 (two) times daily.    Dispense:  120 tablet    Refill:  12     Suanne Marker, MD 05/24/2013, 12:11 PM Certified in Neurology, Neurophysiology and Neuroimaging  [facility] [address] [place], [place] [address] [phone]

## 2013-05-28 ENCOUNTER — Ambulatory Visit: Payer: Medicare Other | Admitting: Physical Therapy

## 2013-05-29 ENCOUNTER — Ambulatory Visit: Payer: Medicare Other | Admitting: Physical Therapy

## 2013-06-04 ENCOUNTER — Ambulatory Visit: Payer: Medicare Other | Admitting: Physical Therapy

## 2013-06-04 ENCOUNTER — Ambulatory Visit: Payer: Medicare Other | Admitting: Occupational Therapy

## 2013-06-07 ENCOUNTER — Ambulatory Visit: Payer: Medicare Other | Attending: Internal Medicine | Admitting: Physical Therapy

## 2013-06-07 ENCOUNTER — Ambulatory Visit: Payer: Medicare Other | Admitting: Occupational Therapy

## 2013-06-07 DIAGNOSIS — M6281 Muscle weakness (generalized): Secondary | ICD-10-CM | POA: Insufficient documentation

## 2013-06-07 DIAGNOSIS — R269 Unspecified abnormalities of gait and mobility: Secondary | ICD-10-CM | POA: Insufficient documentation

## 2013-06-07 DIAGNOSIS — IMO0001 Reserved for inherently not codable concepts without codable children: Secondary | ICD-10-CM | POA: Insufficient documentation

## 2013-06-11 ENCOUNTER — Ambulatory Visit: Payer: Medicare Other | Admitting: Physical Therapy

## 2013-06-11 ENCOUNTER — Ambulatory Visit: Payer: Medicare Other | Admitting: Occupational Therapy

## 2013-06-13 ENCOUNTER — Ambulatory Visit: Payer: Medicare Other | Admitting: Occupational Therapy

## 2013-06-13 ENCOUNTER — Ambulatory Visit: Payer: Medicare Other | Admitting: Physical Therapy

## 2013-06-18 ENCOUNTER — Telehealth: Payer: Self-pay | Admitting: Diagnostic Neuroimaging

## 2013-06-18 ENCOUNTER — Ambulatory Visit: Payer: Medicare Other | Admitting: Physical Therapy

## 2013-06-18 ENCOUNTER — Ambulatory Visit: Payer: Medicare Other | Admitting: Occupational Therapy

## 2013-06-20 NOTE — Telephone Encounter (Signed)
I called and spoke to mother of pt.   Pt is not there.  He still c/o some of dizziness.   Is not taking the plavix and only aspirin.  Pt thinks keppra?  I told her that the keppra can cause dizziness and drowsiness and could improve over time.  (increased to 1500mg  po bid).  She would have pt call us.

## 2013-06-20 NOTE — Telephone Encounter (Signed)
Spoke to mother. Patient was not available, alternate number. Mother says patient c/o of dizziness and off balance and believes it could be due to the Sarita 1500mg  bid increase. Would like advice.

## 2013-06-20 NOTE — Telephone Encounter (Signed)
I consulted Dr. Marjory Lies about pt sx.  Would monitor. Did relay to mother re: as below,  acclimate over time.  Need to relay to pt when he calls.

## 2013-06-21 ENCOUNTER — Telehealth: Payer: Self-pay | Admitting: Diagnostic Neuroimaging

## 2013-06-21 ENCOUNTER — Ambulatory Visit: Payer: Medicare Other | Admitting: Occupational Therapy

## 2013-06-21 ENCOUNTER — Ambulatory Visit: Payer: Medicare Other | Admitting: Physical Therapy

## 2013-06-21 NOTE — Telephone Encounter (Signed)
I called pt and was not able to LM.  Increase of keppra due to seizures.

## 2013-06-25 ENCOUNTER — Ambulatory Visit: Payer: Medicare Other | Admitting: Occupational Therapy

## 2013-06-25 ENCOUNTER — Ambulatory Visit: Payer: Medicare Other | Admitting: Physical Therapy

## 2013-06-25 NOTE — Telephone Encounter (Signed)
I called pt and was not able to LM.

## 2013-06-28 ENCOUNTER — Ambulatory Visit: Payer: Medicare Other | Admitting: Occupational Therapy

## 2013-06-28 ENCOUNTER — Ambulatory Visit: Payer: Medicare Other | Admitting: Physical Therapy

## 2013-07-02 ENCOUNTER — Ambulatory Visit: Payer: Medicare Other | Admitting: Physical Therapy

## 2013-07-02 ENCOUNTER — Ambulatory Visit: Payer: Medicare Other | Admitting: Occupational Therapy

## 2013-07-04 ENCOUNTER — Ambulatory Visit: Payer: Medicare Other | Admitting: Occupational Therapy

## 2013-07-04 ENCOUNTER — Ambulatory Visit: Payer: Medicare Other | Admitting: Physical Therapy

## 2013-11-11 ENCOUNTER — Encounter: Payer: Self-pay | Admitting: Nurse Practitioner

## 2013-11-11 ENCOUNTER — Ambulatory Visit (INDEPENDENT_AMBULATORY_CARE_PROVIDER_SITE_OTHER): Payer: Medicare HMO | Admitting: Nurse Practitioner

## 2013-11-11 VITALS — BP 124/82 | HR 79 | Ht 69.5 in | Wt 179.0 lb

## 2013-11-11 DIAGNOSIS — I69359 Hemiplegia and hemiparesis following cerebral infarction affecting unspecified side: Secondary | ICD-10-CM

## 2013-11-11 DIAGNOSIS — R569 Unspecified convulsions: Secondary | ICD-10-CM

## 2013-11-11 DIAGNOSIS — Z8673 Personal history of transient ischemic attack (TIA), and cerebral infarction without residual deficits: Secondary | ICD-10-CM

## 2013-11-11 MED ORDER — ASPIRIN EC 81 MG PO TBEC: 81.0000 mg | DELAYED_RELEASE_TABLET | Freq: Every day | ORAL | Status: DC

## 2013-11-11 MED ORDER — LEVETIRACETAM 750 MG PO TABS: 1500.0000 mg | ORAL_TABLET | Freq: Two times a day (BID) | ORAL | Status: DC

## 2013-11-11 NOTE — Patient Instructions (Signed)
Continue Levitiracetam 1500 mg twice daily.  Follow up in 6 months, sooner as needed.

## 2013-11-11 NOTE — Progress Notes (Signed)
PATIENT: Eugene [name] DOB: 24-Nov-1960   REASON FOR VISIT: follow up for partial seizures HISTORY FROM: patient  HISTORY OF PRESENT ILLNESS: UPDATE 11/11/13 LL: Patient returns for follow up and states he has been doing well, no convulsions since last visit.  He reports that he has a tingling sensation in the right parietal area of his head sometimes which he states feels like when he used to smoke crack (which he hasn't done in many years), but no seizure follows.  He is tolerating Keppra well, taking 1500 mg bid.  UPDATE 05/24/13: Since last visit, patient had one visit to emergency room for possible seizure versus stroke. Currently patient has been having dental problems, headache 2 distraction was placed on antibiotics pain medication. One week later, patient was on the bus and had fairly sudden onset feeling as though he might have a partial seizure. Previous partial seizures involve convulsions on the right arm and right leg. Patient sat on the bus but did not go into convulsions. When he stood up he felt weakness in his right leg. Patient went to the hospital for further evaluation. He was admitted under TIA protocol. MRI of the brain was done which showed no acute findings. Patient was discharged on Plavix instead of aspirin.  Since that time patient has done well. No further events. He does feel dizziness and has been seeing dark spots, since starting Plavix. He has not missed a seizure medications.   UPDATE 01/14/13: Doing well. Had 1 breakthrogh sz in Aug 2013, when he missed meds. Otherwise doing well.   UPDATE 02/01/12: Had another breakthrough sz (right arm/leg convulsions) in Feb 2013. Had arguement with daughter that evening by phone. Then had sz at 4am, when he woke up. No missed doses. No other factors.  PRIOR HPI: 53 year old right-handed male with history of intracerebral hemorrhage, here for evaluation of syncope versus seizure.  The patient was admitted to [facility]  in January 2012 for left frontal intracerebral hemorrhage with right upper and lower extremity weakness. He was discharged to rehabilitation in [facility]. He was doing well with improvement of right-sided weakness.  On June 20, 2011, he was in the bathroom, shaving, when he developed lightheadedness, called to his sister for help, and fell to the ground. When she came to the bathroom she found him shaking for approximately one to 2 minutes. He was profusely sweating. No tongue biting or incontinence. After the episode, patient had significant right upper and lower extremity weakness. He was somewhat confused for several hours after the event. He went to the emergency room, had CT scan of the head and was discharged with a diagnosis of syncope.  Since that time he has had no further events.   REVIEW OF SYSTEMS: Full 14 system review of systems performed and notable only for seizure vs TIA.   ALLERGIES: No Known Allergies  HOME MEDICATIONS: Outpatient Prescriptions Prior to Visit  Medication Sig Dispense Refill  . amantadine (SYMMETREL) 100 MG capsule Take 100 mg by mouth every morning.      . clopidogrel (PLAVIX) 75 MG tablet Take 1 tablet (75 mg total) by mouth daily with breakfast.  30 tablet  0  . levETIRAcetam (KEPPRA) 750 MG tablet Take 2 tablets (1,500 mg total) by mouth 2 (two) times daily.  120 tablet  12  . Multiple Vitamin (MULTIVITAMIN WITH MINERALS) TABS Take 1 tablet by mouth every morning.      . pravastatin (PRAVACHOL) 20 MG  tablet Take 20 mg by mouth every morning.      . verapamil (CALAN) 80 MG tablet Take 80 mg by mouth 3 (three) times daily.         No facility-administered medications prior to visit.    PAST MEDICAL HISTORY: Past Medical History  Diagnosis Date  . Seizures   . Hypertension   . Stroke     PAST SURGICAL HISTORY: No past surgical history on file.  FAMILY HISTORY: No family history on file.  SOCIAL HISTORY: History   Social  History  . Marital Status: Single    Spouse Name: N/A    Number of Children: 2  . Years of Education: 12   Occupational History  .     Social History Main Topics  . Smoking status: Current Every Day Smoker -- 0.50 packs/day for 20 years    Types: Cigarettes  . Smokeless tobacco: Never Used     Comment: states he smokes maybe 2-3 cigarettes a day  . Alcohol Use: No  . Drug Use: No  . Sexual Activity: Not Currently   Other Topics Concern  . Not on file   Social History Narrative   Pt lives at home with caregiver    Pt is right handed   Education 12   Caffeine 0     PHYSICAL EXAM  Filed Vitals:   11/11/13 0933  BP: 124/82  Pulse: 79  Height: 5' 9.5" (1.765 m)  Weight: 179 lb (81.194 kg)   Body mass index is 26.06 kg/(m^2).  Generalized: Well developed, in no acute distress  Head: normocephalic and atraumatic. Oropharynx benign  Neck: Supple, no carotid bruits  Cardiac: Regular rate rhythm, no murmur  Musculoskeletal: No deformity   Neurological examination  MENTAL STATUS: awake, alert, language fluent, comprehension intact, naming intact  CRANIAL NERVE: no papilledema on fundoscopic exam, pupils equal and reactive to light, visual fields full to confrontation, extraocular muscles intact, no nystagmus, facial sensation symmetric, DECR RIGHT LOWER FACIAL STRENGTH, uvula midline, shoulder shrug symmetric, tongue midline.  MOTOR: normal bulk and tone, full strength in the LUE AND LLE. RUE TRICEPS 4, RLE (HF 4, KE 4, KF 3, DF 2).   SENSORY: normal and symmetric to light touch  COORDINATION: finger-nose-finger, fine finger movements normal  REFLEXES: deep tendon reflexes present and INCREASED ON RIGHT SIDE  GAIT/STATION: MILD RIGHT HEMIPARETIC GAIT, AFO on right foot.  DIAGNOSTIC DATA (LABS, IMAGING, TESTING) - I reviewed patient records, labs, notes, testing and imaging myself where available.  Lab Results  Component Value Date   WBC 9.1 05/04/2013   HGB 13.8  05/04/2013   HCT 36.7* 05/04/2013   MCV 80.7 05/04/2013   PLT 108* 05/04/2013      Component Value Date/Time   NA 136 05/04/2013 1139   K 3.8 05/04/2013 1139   CL 99 05/04/2013 1139   CO2 27 05/04/2013 1139   GLUCOSE 90 05/04/2013 1139   BUN 16 05/04/2013 1139   CREATININE 1.06 05/04/2013 1139   CALCIUM 10.1 05/04/2013 1139   PROT 7.8 05/04/2013 1139   ALBUMIN 4.0 05/04/2013 1139   AST 24 05/04/2013 1139   ALT 20 05/04/2013 1139   ALKPHOS 74 05/04/2013 1139   BILITOT 0.9 05/04/2013 1139   GFRNONAA 79* 05/04/2013 1139   GFRAA >90 05/04/2013 1139   Lab Results  Component Value Date   CHOL 124 05/05/2013   HDL 43 05/05/2013   LDLCALC 67 05/05/2013   TRIG 72 05/05/2013   CHOLHDL 2.9  05/05/2013   Lab Results  Component Value Date   HGBA1C 4.5 05/04/2013   05/04/13 MRI brain (without) - No acute finding. 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 Background pattern of moderate chronic small vessel disease throughout the cerebral hemispheric white matter.  05/05/13 MRA head - normal  05/06/13 carotid u/s - Bilateral: intimal wall thickening CCA. Mild soft plaque origin ICA. 0-39% ICA stenosis. vertebral artery flow is antegrade. ICA/CCA ratio: R-1.39 L-1.0  05/05/13 TTE - No prior study for comparison. Mild basal septal hypertrophy with LVEF 55-60%, mild diastolic dysfunction. No clear evidence of ASD or PFO.   ASSESSMENT AND PLAN 52 y.o. 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 Now with episode of loss of consciousness, convulsions, postictal right-sided weakness, postictal confusion on June 20, 2011. Another partial sz in Feb 2013 and Aug 2013. Recent event in June 2014 may have been partial seizure, seizure aura or TIA. No further episodes since that time.  PLAN:  1. Continue LEV to 1500mg  BID. Continue Aspirin 81mg . 2. safety and seizure precautions reviewed  3. Follow up in 6 months, sooner as  needed.  Ronal Fear, MSN, NP-C 11/11/2013, 10:02 AM [facility] [address], [address] [place], [place] [address] [phone]  Note: This document was prepared with digital dictation and possible smart phrase technology. Any transcriptional errors that result from this process are unintentional.

## 2013-11-22 NOTE — Progress Notes (Signed)
I reviewed note and agree with plan.   Virgie Chery R. Kathalene Sporer, MD 11/22/2013, 1:53 PM Certified in Neurology, Neurophysiology and Neuroimaging  [facility] [address], [address] [place],  [address] [phone]  

## 2014-04-08 ENCOUNTER — Telehealth: Payer: Self-pay | Admitting: Diagnostic Neuroimaging

## 2014-04-08 NOTE — Telephone Encounter (Signed)
Patient calling to inform office that he will now be getting his medications through his [facility]. If questions, please call patient and advise.

## 2014-04-08 NOTE — Telephone Encounter (Signed)
FYI

## 2014-04-10 ENCOUNTER — Other Ambulatory Visit: Payer: Self-pay

## 2014-04-10 MED ORDER — LEVETIRACETAM 750 MG PO TABS: 1500.0000 mg | ORAL_TABLET | Freq: Two times a day (BID) | ORAL | Status: DC

## 2014-05-07 ENCOUNTER — Ambulatory Visit (INDEPENDENT_AMBULATORY_CARE_PROVIDER_SITE_OTHER): Payer: Commercial Managed Care - HMO | Admitting: Nurse Practitioner

## 2014-05-07 ENCOUNTER — Encounter: Payer: Self-pay | Admitting: Nurse Practitioner

## 2014-05-07 ENCOUNTER — Encounter (INDEPENDENT_AMBULATORY_CARE_PROVIDER_SITE_OTHER): Payer: Self-pay

## 2014-05-07 VITALS — BP 114/73 | HR 58 | Wt 181.0 lb

## 2014-05-07 DIAGNOSIS — I69959 Hemiplegia and hemiparesis following unspecified cerebrovascular disease affecting unspecified side: Secondary | ICD-10-CM

## 2014-05-07 DIAGNOSIS — R569 Unspecified convulsions: Secondary | ICD-10-CM

## 2014-05-07 DIAGNOSIS — I69359 Hemiplegia and hemiparesis following cerebral infarction affecting unspecified side: Secondary | ICD-10-CM

## 2014-05-07 DIAGNOSIS — F17200 Nicotine dependence, unspecified, uncomplicated: Secondary | ICD-10-CM

## 2014-05-07 NOTE — Patient Instructions (Addendum)
Continue Levetiracetam 1500mg  twice daily. Continue Aspirin 81mg .  Safety and seizure precautions reviewed   Please start water therapy to build up strength in your right leg. Please stop smoking! Follow up in 6 months, sooner as needed.

## 2014-05-07 NOTE — Progress Notes (Signed)
PATIENT: Eugene [name] DOB: [birth year]  REASON FOR VISIT: routine follow up for partial seizures, hx of stroke HISTORY FROM: patient  HISTORY OF PRESENT ILLNESS: UPDATE 05/07/14 (LL):  Since last visit, doing well, no seizures or stroke symptoms.  Having increased difficulty lately lifting right leg.  Seems to be dragging it more. He is tolerating Keppra well, taking 1500 mg bid. Labs done recently at Dr. Mathews [facility]insonsS[facility]ton's office were reportedly good. Blood pressure is well controlled, it is 114/73 in office today. He started back smoking cigarettes again, smoking less than half pack a day, knows he needs to quit.  UPDATE 11/11/13 LL: Patient returns for follow up and states he has been doing well, no convulsions since last visit. He reports that he has a tingling sensation in the right parietal area of his head sometimes which he states feels like when he used to smoke crack (which he hasn't done in many years), but no seizure follows. He is tolerating Keppra well, taking 1500 mg bid.  UPDATE 05/24/13: Since last visit, patient had one visit to emergency room for possible seizure versus stroke. Currently patient has been having dental problems, headache 2 distraction was placed on antibiotics pain medication. One week later, patient was on the bus and had fairly sudden onset feeling as though he might have a partial seizure. Previous partial seizures involve convulsions on the right arm and right leg. Patient sat on the bus but did not go into convulsions. When he stood up he felt weakness in his right leg. Patient went to the hospital for further evaluation. He was admitted under TIA protocol. MRI of the brain was done which showed no acute findings. Patient was discharged on Plavix instead of aspirin.  Since that time patient has done well. No further events. He does feel dizziness and has been seeing dark spots, since starting Plavix. He has not missed a seizure medications.  UPDATE 01/14/13: Doing well. Had  1 breakthrogh sz in Aug 2013, when he missed meds. Otherwise doing well.  UPDATE 02/01/12: Had another breakthrough sz (right arm/leg convulsions) in Feb 2013. Had arguement with daughter that evening by phone. Then had sz at 4am, when he woke up. No missed doses. No other factors.  PRIOR HPI: 53 year old right-handed male with history of intracerebral hemorrhage, here for evaluation of syncope versus seizure.  The patient was [facility] in January 2012 for left frontal intracerebral hemorrhage with right upper and lower extremity weakness. [facility]. He was doing well with improvement of right-sided weakness.  On June 20, 2011, he was in the bathroom, shaving, when he developed lightheadedness, called to his sister for help, and fell to the ground. When she came to the bathroom she found him shaking for approximately one to 2 minutes. He was profusely sweating. No tongue biting or incontinence. After the episode, patient had significant right upper and lower extremity weakness. He was somewhat confused for several hours after the event. He went to the emergency room, had CT scan of the head and was discharged with a diagnosis of syncope.  Since that time he has had no further events.   REVIEW OF SYSTEMS: Full 14 system review of systems performed and notable only for walking difficulty, moles, weakness, memory loss.  ALLERGIES: No Known Allergies  HOME MEDICATIONS: Outpatient Prescriptions Prior to Visit  Medication Sig Dispense Refill  . amantadine (SYMMETREL) 100 MG capsule Take 100 mg by  mouth every morning.      Marland Kitchen. aspirin EC 81 MG tablet Take 1 tablet (81 mg total) by mouth daily.      Marland Kitchen. levETIRAcetam (KEPPRA) 750 MG tablet Take 2 tablets (1,500 mg total) by mouth 2 (two) times daily.  360 tablet  1  . losartan-hydrochlorothiazide (HYZAAR) 100-25 MG per tablet Take by mouth daily. 100-25 mg      . Multiple Vitamin  (MULTIVITAMIN WITH MINERALS) TABS Take 1 tablet by mouth every morning.      . pravastatin (PRAVACHOL) 20 MG tablet Take 20 mg by mouth every morning.      . verapamil (CALAN) 80 MG tablet Take 80 mg by mouth 3 (three) times daily.         No facility-administered medications prior to visit.    PHYSICAL EXAM Filed Vitals:   05/07/14 0927  BP: 114/73  Pulse: 58  Weight: 181 lb (82.101 kg)   Body mass index is 26.35 kg/(m^2). No exam data present  Generalized: Well developed, in no acute distress  Head: normocephalic and atraumatic. Oropharynx benign  Neck: Supple, no carotid bruits  Cardiac: Regular rate rhythm, no murmur  Musculoskeletal: No deformity   Neurological examination  Mentation: Alert oriented to time, place, history taking. Follows all commands speech and language fluent Cranial nerve II-XII: Fundoscopic exam not done. Pupils were equal round reactive to light extraocular movements were full, visual field were full on confrontational test. Facial sensation normal, DECR RIGHT LOWER FACIAL STRENGTH, hearing was intact to finger rubbing bilaterally. Uvula tongue midline. head turning and shoulder shrug and were normal and symmetric.Tongue protrusion into cheek strength was normal. Motor: normal bulk and tone, full strength in the LUE AND LLE. RUE TRICEPS 4, RLE (HF 4, KE 4, KF 3, DF 2).  Sensory: Sensory testing is intact to soft touch on all 4 extremities. No evidence of extinction is noted.  Coordination: Cerebellar testing reveals good finger-nose-finger and heel-to-shin bilaterally.  Gait and station: MILD RIGHT HEMIPARETIC GAIT, AFO on right foot.  Reflexes: deep tendon reflexes present and INCREASED ON RIGHT SIDE.     DIAGNOSTIC DATA (LABS, IMAGING, TESTING) - I reviewed patient records, labs, notes, testing and imaging myself where available.  05/04/13 MRI brain (without) - No acute finding. 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 Background  pattern of moderate chronic small vessel disease throughout the cerebral hemispheric white matter.  05/05/13 MRA head - normal  05/06/13 carotid u/s - Bilateral: intimal wall thickening CCA. Mild soft plaque origin ICA. 0-39% ICA stenosis. vertebral artery flow is antegrade. ICA/CCA ratio: R-1.39 L-1.0  05/05/13 TTE - No prior study for comparison. Mild basal septal hypertrophy with LVEF 55-60%, mild diastolic dysfunction. No clear evidence of ASD or PFO.   ASSESSMENT: 53 y.o. 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 Now with episode of loss of consciousness, convulsions, postictal right-sided weakness, postictal confusion on June 20, 2011. Another partial sz in Feb 2013 and Aug 2013. Event in June 2014 may have been partial seizure, seizure aura or TIA. No further episodes since that time.   PLAN:  1. Continue Levetiracetam 1500mg  twice daily. Continue Aspirin 81mg .  2. safety and seizure precautions reviewed  3. Recommend water exercises to increase LE strength, he has been reluctant to go due to not knowing how to swim; provided reassurance. 4. Encouraged to stop smoking. 5. Follow up in 6 months, sooner as needed.  Ronal Fear, MSN, NP-C 05/07/2014, 9:31 AM [facility] [address] [place], [place] [address] [phone]  Note: This document was prepared with digital dictation and possible smart phrase technology. Any transcriptional errors that result from this process are unintentional.

## 2014-05-12 ENCOUNTER — Ambulatory Visit: Payer: Medicare HMO | Admitting: Nurse Practitioner

## 2014-05-15 NOTE — Progress Notes (Signed)
I reviewed note and agree with plan.   VIKRAM R. PENUMALLI, MD  Certified in Neurology, Neurophysiology and Neuroimaging  [facility] [address], [address] [place], [place] [address] [phone]   

## 2014-11-10 ENCOUNTER — Ambulatory Visit (INDEPENDENT_AMBULATORY_CARE_PROVIDER_SITE_OTHER): Payer: Commercial Managed Care - HMO | Admitting: Diagnostic Neuroimaging

## 2014-11-10 ENCOUNTER — Encounter: Payer: Self-pay | Admitting: Diagnostic Neuroimaging

## 2014-11-10 VITALS — BP 119/78 | HR 74 | Temp 97.0°F | Ht 69.5 in | Wt 180.2 lb

## 2014-11-10 DIAGNOSIS — I69959 Hemiplegia and hemiparesis following unspecified cerebrovascular disease affecting unspecified side: Secondary | ICD-10-CM

## 2014-11-10 DIAGNOSIS — I69359 Hemiplegia and hemiparesis following cerebral infarction affecting unspecified side: Secondary | ICD-10-CM

## 2014-11-10 DIAGNOSIS — R569 Unspecified convulsions: Secondary | ICD-10-CM

## 2014-11-10 MED ORDER — LEVETIRACETAM 750 MG PO TABS
1500.0000 mg | ORAL_TABLET | Freq: Two times a day (BID) | ORAL | Status: DC
Start: 2014-11-10 — End: 2015-05-13

## 2014-11-10 NOTE — Progress Notes (Signed)
PATIENT: Eugene [name] DOB: 05-31-1961  REASON FOR VISIT: routine follow up for partial seizures, hx of stroke HISTORY FROM: patient  HISTORY OF PRESENT ILLNESS:  UPDATE 11/10/52 (VRP): Since last visit, no seizures. Doing water aerobics for past 3 months. Slipped off some stairs last month, accidentally. No major injury fortunately.  UPDATE 05/07/14 (LL):  Since last visit, doing well, no seizures or stroke symptoms.  Having increased difficulty lately lifting right leg.  Seems to be dragging it more. He is tolerating Keppra well, taking 1500 mg bid. Labs done recently at Dr. Mathews Robinsons office were reportedly good. Blood pressure is well controlled, it is 114/73 in office today. He started back smoking cigarettes again, smoking less than half pack a day, knows he needs to quit.  UPDATE 11/11/13 LL: Patient returns for follow up and states he has been doing well, no convulsions since last visit. He reports that he has a tingling sensation in the right parietal area of his head sometimes which he states feels like when he used to smoke crack (which he hasn't done in many years), but no seizure follows. He is tolerating Keppra well, taking 1500 mg bid.   UPDATE 05/24/13: Since last visit, patient had one visit to emergency room for possible seizure versus stroke. Currently patient has been having dental problems, headache 2 distraction was placed on antibiotics pain medication. One week later, patient was on the bus and had fairly sudden onset feeling as though he might have a partial seizure. Previous partial seizures involve convulsions on the right arm and right leg. Patient sat on the bus but did not go into convulsions. When he stood up he felt weakness in his right leg. Patient went to the hospital for further evaluation. He was admitted under TIA protocol. MRI of the brain was done which showed no acute findings. Patient was discharged on Plavix instead of aspirin. Since that time patient has done  well. No further events. He does feel dizziness and has been seeing dark spots, since starting Plavix. He has not missed a seizure medications.   UPDATE 01/14/13: Doing well. Had 1 breakthrogh sz in Aug 2013, when he missed meds. Otherwise doing well.   UPDATE 02/01/12: Had another breakthrough sz (right arm/leg convulsions) in Feb 2013. Had arguement with daughter that evening by phone. Then had sz at 4am, when he woke up. No missed doses. No other factors.   PRIOR HPI: 54 year old right-handed male with history of intracerebral hemorrhage, here for evaluation of syncope versus seizure.  The patient was admitted to [facility] in January 2012 for left frontal intracerebral hemorrhage with right upper and lower extremity weakness. He was discharged to rehabilitation in [facility]. He was doing well with improvement of right-sided weakness. On June 20, 2011, he was in the bathroom, shaving, when he developed lightheadedness, called to his sister for help, and fell to the ground. When she came to the bathroom she found him shaking for approximately one to 2 minutes. He was profusely sweating. No tongue biting or incontinence. After the episode, patient had significant right upper and lower extremity weakness. He was somewhat confused for several hours after the event. He went to the emergency room, had CT scan of the head and was discharged with a diagnosis of syncope. Since that time he has had no further events.    REVIEW OF SYSTEMS: Full 14 system review of systems performed and notable only for decr  Appetite increased activity  seizure d/o speech diff weakness on right side.    ALLERGIES: No Known Allergies  HOME MEDICATIONS: Outpatient Prescriptions Prior to Visit  Medication Sig Dispense Refill  . amantadine (SYMMETREL) 100 MG capsule Take 100 mg by mouth every morning.    Marland Kitchen aspirin EC 81 MG tablet Take 1 tablet (81 mg total) by mouth daily.    Marland Kitchen levETIRAcetam (KEPPRA)  750 MG tablet Take 2 tablets (1,500 mg total) by mouth 2 (two) times daily. 360 tablet 1  . losartan-hydrochlorothiazide (HYZAAR) 100-25 MG per tablet Take by mouth daily. 100-25 mg    . Multiple Vitamin (MULTIVITAMIN WITH MINERALS) TABS Take 1 tablet by mouth every morning.    . pravastatin (PRAVACHOL) 20 MG tablet Take 20 mg by mouth every morning.    . verapamil (CALAN) 80 MG tablet Take 80 mg by mouth 3 (three) times daily.       No facility-administered medications prior to visit.    PHYSICAL EXAM Filed Vitals:   11/10/14 0806  BP: 119/78  Pulse: 74  Temp: 97 F (36.1 C)  TempSrc: Oral  Height: 5' 9.5" (1.765 m)  Weight: 180 lb 3.2 oz (81.738 kg)   Body mass index is 26.24 kg/(m^2). No exam data present  Generalized: Well developed, in no distress  Neck: Supple, no carotid bruits  Cardiac: Regular rate rhythm, no murmur    Neurological examination  Mentation: Alert oriented to time, place, history taking. Follows all commands speech and language fluent Cranial nerve II-XII: Pupils were equal round reactive to light extraocular movements were full, visual field were full on confrontational test. Facial sensation normal, DECR RIGHT LOWER FACIAL STRENGTH, hearing was intact to finger rubbing bilaterally. Uvula tongue midline. head turning and shoulder shrug and were normal and symmetric.Tongue protrusion into cheek strength was normal. Motor: normal bulk and tone, full strength in the LUE AND LLE. RUE TRICEPS 4, RLE (HF 4, KE 4, KF 3, DF 2).  Sensory: Sensory testing is intact to soft touch on all 4 extremities. No evidence of extinction is noted.  Coordination: Cerebellar testing reveals good finger-nose-finger and heel-to-shin bilaterally.  Gait and station: MILD RIGHT HEMIPARETIC GAIT, AFO on right foot.  Reflexes: deep tendon reflexes present and INCREASED ON RIGHT SIDE.      DIAGNOSTIC DATA (LABS, IMAGING, TESTING) - I reviewed patient records, labs, notes, testing and  imaging myself where available.  05/04/13 MRI brain (without) - No acute finding. 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 Background pattern of moderate chronic small vessel disease throughout the cerebral hemispheric white matter.   05/05/13 MRA head - normal   05/06/13 carotid u/s - Bilateral: intimal wall thickening CCA. Mild soft plaque origin ICA. 0-39% ICA stenosis. vertebral artery flow is antegrade. ICA/CCA ratio: R-1.39 L-1.0   05/05/13 TTE - No prior study for comparison. Mild basal septal hypertrophy with LVEF 55-60%, mild diastolic dysfunction. No clear evidence of ASD or PFO.    ASSESSMENT: 54 y.o. 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 Now with episode of loss of consciousness, convulsions, postictal right-sided weakness, postictal confusion on June 20, 2011. Another partial sz in Feb 2013 and Aug 2013. Event in June 2014 may have been partial seizure, seizure aura or TIA. No further episodes since that time. Doing well on LEV 1500mg  BID.   PLAN:  1. Continue levetiracetam 1500mg  twice daily. Continue Aspirin 81mg .  2. Safety and seizure precautions reviewed  Return in about 6 months (around 05/11/2015).    Suanne Marker, MD 11/10/2014, 9:02 AM Certified in Neurology, Neurophysiology and Neuroimaging  [facility] [address] [place], [place] [address] [phone]

## 2014-11-10 NOTE — Patient Instructions (Signed)
Continue current medications. 

## 2014-12-04 ENCOUNTER — Telehealth: Payer: Self-pay | Admitting: Internal Medicine

## 2014-12-04 NOTE — Telephone Encounter (Signed)
S/W PATIENT AND GAVE NP APPT FOR 02/18 @ 11 W/DR. MOHAMED REFERRING TAKIA STARKES DX- THROMBOCYTOPENIA

## 2014-12-24 ENCOUNTER — Other Ambulatory Visit: Payer: Self-pay | Admitting: *Deleted

## 2014-12-24 DIAGNOSIS — D696 Thrombocytopenia, unspecified: Secondary | ICD-10-CM

## 2014-12-25 ENCOUNTER — Encounter: Payer: Self-pay | Admitting: Internal Medicine

## 2014-12-25 ENCOUNTER — Ambulatory Visit: Payer: Commercial Managed Care - HMO

## 2014-12-25 ENCOUNTER — Other Ambulatory Visit (HOSPITAL_BASED_OUTPATIENT_CLINIC_OR_DEPARTMENT_OTHER): Payer: Commercial Managed Care - HMO

## 2014-12-25 ENCOUNTER — Ambulatory Visit (HOSPITAL_BASED_OUTPATIENT_CLINIC_OR_DEPARTMENT_OTHER): Payer: Commercial Managed Care - HMO | Admitting: Internal Medicine

## 2014-12-25 VITALS — BP 119/80 | HR 65 | Temp 98.7°F | Resp 18 | Ht 69.5 in | Wt 184.4 lb

## 2014-12-25 DIAGNOSIS — F1099 Alcohol use, unspecified with unspecified alcohol-induced disorder: Secondary | ICD-10-CM

## 2014-12-25 DIAGNOSIS — Z720 Tobacco use: Secondary | ICD-10-CM

## 2014-12-25 DIAGNOSIS — D696 Thrombocytopenia, unspecified: Secondary | ICD-10-CM | POA: Diagnosis not present

## 2014-12-25 LAB — CBC WITH DIFFERENTIAL/PLATELET
BASO%: 1.2 % (ref 0.0–2.0)
Basophils Absolute: 0.1 10*3/uL (ref 0.0–0.1)
EOS ABS: 0.1 10*3/uL (ref 0.0–0.5)
EOS%: 1.2 % (ref 0.0–7.0)
HCT: 44 % (ref 38.4–49.9)
HGB: 14.9 g/dL (ref 13.0–17.1)
LYMPH#: 2 10*3/uL (ref 0.9–3.3)
LYMPH%: 24 % (ref 14.0–49.0)
MCH: 28.8 pg (ref 27.2–33.4)
MCHC: 33.8 g/dL (ref 32.0–36.0)
MCV: 85.3 fL (ref 79.3–98.0)
MONO#: 0.7 10*3/uL (ref 0.1–0.9)
MONO%: 8.1 % (ref 0.0–14.0)
NEUT%: 65.5 % (ref 39.0–75.0)
NEUTROS ABS: 5.5 10*3/uL (ref 1.5–6.5)
Platelets: 131 10*3/uL — ABNORMAL LOW (ref 140–400)
RBC: 5.15 10*6/uL (ref 4.20–5.82)
RDW: 21.3 % — AB (ref 11.0–14.6)
WBC: 8.4 10*3/uL (ref 4.0–10.3)

## 2014-12-25 LAB — COMPREHENSIVE METABOLIC PANEL (CC13)
ALK PHOS: 82 U/L (ref 40–150)
ALT: 59 U/L — AB (ref 0–55)
AST: 56 U/L — AB (ref 5–34)
Albumin: 4.5 g/dL (ref 3.5–5.0)
Anion Gap: 9 mEq/L (ref 3–11)
BUN: 10.2 mg/dL (ref 7.0–26.0)
CO2: 28 mEq/L (ref 22–29)
CREATININE: 1 mg/dL (ref 0.7–1.3)
Calcium: 9.4 mg/dL (ref 8.4–10.4)
Chloride: 102 mEq/L (ref 98–109)
EGFR: >90 mL/min/{1.73_m2} (ref >90)
Glucose: 95 mg/dl (ref 70–140)
POTASSIUM: 3.8 meq/L (ref 3.5–5.1)
Sodium: 139 mEq/L (ref 136–145)
Total Bilirubin: 1.12 mg/dL (ref 0.20–1.20)
Total Protein: 7.9 g/dL (ref 6.4–8.3)

## 2014-12-25 LAB — LACTATE DEHYDROGENASE (CC13): LDH: 280 U/L — AB (ref 125–245)

## 2014-12-25 NOTE — Progress Notes (Signed)
[facility] Telephone:([phone]   Fax:([phone]  CONSULT NOTE  REFERRING PHYSICIAN: Dr. Dorothyann Pengobyn [name]  REASON FOR CONSULTATION:  54 years old African-American male with thrombocytopenia  HPI Eugene [name] is a 54 y.o. 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 The patient was seen recently by his primary care physician and CBC performed on 11/17/2014 showed low platelets count of 130,000. He was referred to me for further evaluation and recommendation regarding this abnormality. The patient is feeling fine today with no specific complaints. He denied having any significant bleeding, bruises or ecchymosis. He has a history of seizure disorder and currently on Keppra. The patient has a long history of drug abuse including cocaine and alcohol abuse. He still drinks alcohol at regular basis. He is also on several other medications including Celebrex, aspirin and Pravachol.  Reviewing his previous records the patient has history of thrombocytopenia for several years, at least 5 years according to the available records. On 11/21/2010 his platelets count was 131,000, on 06/20/2011 it was 122,000. On 05/04/2013 platelets count were 108,000. The patient denied having any significant complaints today. He denied having any significant chest pain, shortness breath, cough or hemoptysis. He denied having any significant weight loss or night sweats. The patient denied having any nausea or vomiting, no fever or chills. He has mild arthralgia of the left knee. Family history significant for a father with seizure and mother still alive and has diabetes mellitus and COPD. The patient is single and has 2 children. He is currently on disability. Used to work at [employer]. He has a history of smoking one half a pack per day  for around 40 years. He continues to drink alcohol at regular basis and has history of drug abuse but not recently.  HPI  Past Medical History  Diagnosis Date  . Seizures   . Hypertension   . Stroke     Past Surgical History  Procedure Laterality Date  . Hernia repair      Family History  Problem Relation Age of Onset  . Asthma Mother   . Diabetes Mother   . Other Father     natural causes    Social History History  Substance Use Topics  . Smoking status: Current Every Day Smoker -- 0.50 packs/day for 20 years    Types: Cigarettes  . Smokeless tobacco: Never Used     Comment: states he smokes maybe 2-3 cigarettes a day  . Alcohol Use: 0.6 oz/week    1 Cans of beer per week    No Known Allergies  Current Outpatient Prescriptions  Medication Sig Dispense Refill  . amantadine (SYMMETREL) 100 MG capsule Take 100 mg by mouth every morning.    Marland Kitchen. aspirin EC 81 MG tablet Take 1 tablet (81 mg total) by mouth daily.    . celecoxib (CELEBREX) 200 MG capsule     . levETIRAcetam (KEPPRA) 750 MG tablet Take 2 tablets (1,500 mg total) by mouth 2 (two) times daily. 360 tablet 4  . losartan-hydrochlorothiazide (HYZAAR) 100-25 MG per tablet Take by mouth daily. 100-25 mg    . Multiple Vitamin (MULTIVITAMIN WITH MINERALS) TABS Take 1 tablet by mouth every morning.    . pravastatin (PRAVACHOL) 20 MG tablet Take 20 mg by mouth every morning.    . verapamil (CALAN) 80 MG tablet Take 80  mg by mouth 3 (three) times daily.       No current facility-administered medications for this visit.    Review of Systems  Constitutional: negative Eyes: negative Ears, nose, mouth, throat, and face: negative Respiratory: negative Cardiovascular: negative Gastrointestinal: negative Genitourinary:negative Integument/breast: negative Hematologic/lymphatic: negative Musculoskeletal:positive for muscle weakness Neurological: negative Behavioral/Psych: negative Endocrine:  negative Allergic/Immunologic: negative  Physical Exam  ZOX:WRUEA, healthy, no distress, well nourished and well developed SKIN: skin color, texture, turgor are normal, no rashes or significant lesions HEAD: Normocephalic, No masses, lesions, tenderness or abnormalities EYES: normal, PERRLA EARS: External ears normal, Canals clear OROPHARYNX:no exudate, no erythema and lips, buccal mucosa, and tongue normal  NECK: supple, no adenopathy, no JVD LYMPH:  no palpable lymphadenopathy, no hepatosplenomegaly LUNGS: clear to auscultation , and palpation HEART: regular rate & rhythm and no murmurs ABDOMEN:abdomen soft, non-tender, obese, normal bowel sounds and no masses or organomegaly BACK: Back symmetric, no curvature., No CVA tenderness EXTREMITIES:no joint deformities, effusion, or inflammation, no edema, no skin discoloration  NEURO: alert & oriented x 3 with fluent speech, no focal motor/sensory deficits  PERFORMANCE STATUS: ECOG 1  LABORATORY DATA: Lab Results  Component Value Date   WBC 8.4 12/25/2014   HGB 14.9 12/25/2014   HCT 44.0 12/25/2014   MCV 85.3 12/25/2014   PLT 131* 12/25/2014      Chemistry      Component Value Date/Time   NA 139 12/25/2014 1039   NA 136 05/04/2013 1139   K 3.8 12/25/2014 1039   K 3.8 05/04/2013 1139   CL 99 05/04/2013 1139   CO2 28 12/25/2014 1039   CO2 27 05/04/2013 1139   BUN 10.2 12/25/2014 1039   BUN 16 05/04/2013 1139   CREATININE 1.0 12/25/2014 1039   CREATININE 1.06 05/04/2013 1139      Component Value Date/Time   CALCIUM 9.4 12/25/2014 1039   CALCIUM 10.1 05/04/2013 1139   ALKPHOS 82 12/25/2014 1039   ALKPHOS 74 05/04/2013 1139   AST 56* 12/25/2014 1039   AST 24 05/04/2013 1139   ALT 59* 12/25/2014 1039   ALT 20 05/04/2013 1139   BILITOT 1.12 12/25/2014 1039   BILITOT 0.9 05/04/2013 1139       RADIOGRAPHIC STUDIES: No results found.  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 The patient has thrombocytopenia for at least 5 years with no significant complaints and no significant bleeding issues.   PLAN: I had a lengthy discussion with the patient today about his condition. I reviewed his previous records and felt that the patient is very stable with mild thrombocytopenia. He does not require any treatment at this point even if he has ITP. I recommended for the patient to continue her routine observation and follow-up visit with his primary care physician. I would be happy to see the patient in the future if there is any significant open his platelets count less than 50,000 or if the patient becomes symptomatic with significant bruises, ecchymosis or bleeding issues. I strongly encouraged the patient to quit alcohol drinking and smoking. He was advised to call if he has any concerning symptoms.  The patient voices understanding of current disease status and treatment options and is in agreement with the current care plan.  All questions were answered. The patient knows to call the clinic with any problems, questions or concerns. We can certainly see the patient much sooner if necessary.  Thank you  so much for allowing me to participate in the care of [facility]. I will continue to follow up the patient with you and assist in his care.  I spent 40 minutes counseling the patient face to face. The total time spent in the appointment was 60 minutes.  Disclaimer: This note was dictated with voice recognition software. Similar sounding words can inadvertently be transcribed and may not be corrected upon review.   Whit Bruni K. December 25, 2014, 11:37 AM   Jamelle Rushing, PA

## 2014-12-25 NOTE — Progress Notes (Signed)
Checked in new pt with no financial concerns.  Pt states he's here for a hematology concern and he has 2 insurances so financial assistance may not be needed but he has Raquel's card for any billing questions or concerns.

## 2015-05-13 ENCOUNTER — Encounter: Payer: Self-pay | Admitting: Diagnostic Neuroimaging

## 2015-05-13 ENCOUNTER — Ambulatory Visit (INDEPENDENT_AMBULATORY_CARE_PROVIDER_SITE_OTHER): Payer: Commercial Managed Care - HMO | Admitting: Diagnostic Neuroimaging

## 2015-05-13 VITALS — BP 118/75 | HR 72 | Ht 69.5 in | Wt 178.0 lb

## 2015-05-13 DIAGNOSIS — R569 Unspecified convulsions: Secondary | ICD-10-CM | POA: Diagnosis not present

## 2015-05-13 DIAGNOSIS — I69959 Hemiplegia and hemiparesis following unspecified cerebrovascular disease affecting unspecified side: Secondary | ICD-10-CM | POA: Diagnosis not present

## 2015-05-13 DIAGNOSIS — I69359 Hemiplegia and hemiparesis following cerebral infarction affecting unspecified side: Secondary | ICD-10-CM

## 2015-05-13 MED ORDER — LEVETIRACETAM 750 MG PO TABS: 1500.0000 mg | ORAL_TABLET | Freq: Two times a day (BID) | ORAL | Status: DC

## 2015-05-13 NOTE — Patient Instructions (Signed)
Continue current meds 

## 2015-05-13 NOTE — Progress Notes (Signed)
PATIENT: Eugene [name] DOB: [birth year]  REASON FOR VISIT: routine follow up for partial seizures, hx of stroke HISTORY FROM: patient  Chief Complaint  Patient presents with  . Seizures    rm 7  . Follow-up    HISTORY OF PRESENT ILLNESS:  UPDATE 05/13/15 (VRP): Since last visit, doing well, no seizures. No new events. Tolerating meds.   UPDATE 11/10/14 (VRP): Since last visit, no seizures. Doing water aerobics for past 3 months. Slipped off some stairs last month, accidentally. No major injury fortunately.  UPDATE 05/07/14 (LL):  Since last visit, doing well, no seizures or stroke symptoms.  Having increased difficulty lately lifting right leg.  Seems to be dragging it more. He is tolerating Keppra well, taking 1500 mg bid. Labs done recently at Dr. Mathews Robinsons office were reportedly good. Blood pressure is well controlled, it is 114/73 in office today. He started back smoking cigarettes again, smoking less than half pack a day, knows he needs to quit.  UPDATE 11/11/13 LL: Patient returns for follow up and states he has been doing well, no convulsions since last visit. He reports that he has a tingling sensation in the right parietal area of his head sometimes which he states feels like when he used to smoke crack (which he hasn't done in many years), but no seizure follows. He is tolerating Keppra well, taking 1500 mg bid.   UPDATE 05/24/13: Since last visit, patient had one visit to emergency room for possible seizure versus stroke. Currently patient has been having dental problems, headache 2 distraction was placed on antibiotics pain medication. One week later, patient was on the bus and had fairly sudden onset feeling as though he might have a partial seizure. Previous partial seizures involve convulsions on the right arm and right leg. Patient sat on the bus but did not go into convulsions. When he stood up he felt weakness in his right leg. Patient went to the hospital for further evaluation.  He was admitted under TIA protocol. MRI of the brain was done which showed no acute findings. Patient was discharged on Plavix instead of aspirin. Since that time patient has done well. No further events. He does feel dizziness and has been seeing dark spots, since starting Plavix. He has not missed a seizure medications.   UPDATE 01/14/13: Doing well. Had 1 breakthrogh sz in Aug 2013, when he missed meds. Otherwise doing well.   UPDATE 02/01/12: Had another breakthrough sz (right arm/leg convulsions) in Feb 2013. Had arguement with daughter that evening by phone. Then had sz at 4am, when he woke up. No missed doses. No other factors.   PRIOR HPI: 54 year old right-handed male with history of intracerebral hemorrhage, here for evaluation of syncope versus seizure.  The patient was admitted to [facility] in January 2012 for left frontal intracerebral hemorrhage with right upper and lower extremity weakness. He was discharged to rehabilitation in [facility]. He was doing well with improvement of right-sided weakness. On June 20, 2011, he was in the bathroom, shaving, when he developed lightheadedness, called to his sister for help, and fell to the ground. When she came to the bathroom she found him shaking for approximately one to 2 minutes. He was profusely sweating. No tongue biting or incontinence. After the episode, patient had significant right upper and lower extremity weakness. He was somewhat confused for several hours after the event. He went to the emergency room, had CT scan of the head and was  discharged with a diagnosis of syncope. Since that time he has had no further events.    REVIEW OF SYSTEMS: Full 14 system review of systems performed and notable only as per HPI.     ALLERGIES: No Known Allergies  HOME MEDICATIONS: Outpatient Prescriptions Prior to Visit  Medication Sig Dispense Refill  . aspirin EC 81 MG tablet Take 1 tablet (81 mg total) by mouth daily.     . celecoxib (CELEBREX) 200 MG capsule     . levETIRAcetam (KEPPRA) 750 MG tablet Take 2 tablets (1,500 mg total) by mouth 2 (two) times daily. 360 tablet 4  . losartan-hydrochlorothiazide (HYZAAR) 100-25 MG per tablet Take by mouth daily. 100-25 mg    . Multiple Vitamin (MULTIVITAMIN WITH MINERALS) TABS Take 1 tablet by mouth every morning.    . pravastatin (PRAVACHOL) 20 MG tablet Take 20 mg by mouth every morning.    . verapamil (CALAN) 80 MG tablet Take 80 mg by mouth 3 (three) times daily.      Marland Kitchen amantadine (SYMMETREL) 100 MG capsule Take 100 mg by mouth every morning.     No facility-administered medications prior to visit.    PHYSICAL EXAM Filed Vitals:   05/13/15 1023  BP: 118/75  Pulse: 72  Height: 5' 9.5" (1.765 m)  Weight: 178 lb (80.74 kg)   Body mass index is 25.92 kg/(m^2). No exam data present  Generalized: Well developed, in no distress  Neck: Supple, no carotid bruits  Cardiac: Regular rate rhythm, no murmur    Neurological examination  Mentation: Alert oriented to time, place, history taking. Follows all commands speech and language fluent Cranial nerve II-XII: Pupils were equal round reactive to light extraocular movements were full, visual field were full on confrontational test. Facial sensation normal, DECR RIGHT LOWER FACIAL STRENGTH, hearing was intact to finger rubbing bilaterally. Uvula tongue midline. head turning and shoulder shrug and were normal and symmetric.Tongue protrusion into cheek strength was normal. Motor: normal bulk and tone, full strength in the LUE AND LLE. RUE TRICEPS 4, RLE (HF 4, KE 4, KF 3, DF 2).  Sensory: Sensory testing is intact to soft touch on all 4 extremities. No evidence of extinction is noted.  Coordination: Cerebellar testing reveals good finger-nose-finger and heel-to-shin bilaterally.  Gait and station: MILD RIGHT HEMIPARETIC GAIT, AFO on right foot.  Reflexes: deep tendon reflexes present and INCREASED ON RIGHT SIDE.       DIAGNOSTIC DATA (LABS, IMAGING, TESTING) - I reviewed patient records, labs, notes, testing and imaging myself where available.  05/04/13 MRI brain (without) - No acute finding. 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 Background pattern of moderate chronic small vessel disease throughout the cerebral hemispheric white matter.   05/05/13 MRA head - normal   05/06/13 carotid u/s - Bilateral: intimal wall thickening CCA. Mild soft plaque origin ICA. 0-39% ICA stenosis. vertebral artery flow is antegrade. ICA/CCA ratio: R-1.39 L-1.0   05/05/13 TTE - No prior study for comparison. Mild basal septal hypertrophy with LVEF 55-60%, mild diastolic dysfunction. No clear evidence of ASD or PFO.    ASSESSMENT: 54 y.o. 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 Now with episode of loss of consciousness, convulsions, postictal right-sided weakness, postictal confusion on June 20, 2011. Another partial sz in Feb 2013 and Aug 2013. Event in June 2014 may have been partial seizure, seizure aura or TIA. No further episodes since that time. Doing well  on LEV 1500mg  BID.   PLAN:  1. Continue levetiracetam 1500mg  twice daily. Continue Aspirin 81mg .  2. Safety and seizure precautions reviewed   Meds ordered this encounter  Medications  . levETIRAcetam (KEPPRA) 750 MG tablet    Sig: Take 2 tablets (1,500 mg total) by mouth 2 (two) times daily.    Dispense:  360 tablet    Refill:  4   Return in about 1 year (around 05/12/2016).    Suanne MarkerVIKRAM R. Kajuana Shareef, MD 05/13/2015, 10:41 AM Certified in Neurology, Neurophysiology and Neuroimaging  [facility] [address], [address] [place], [place] [address] [phone]

## 2015-09-03 ENCOUNTER — Telehealth: Payer: Self-pay | Admitting: *Deleted

## 2015-09-03 ENCOUNTER — Encounter: Payer: Self-pay | Admitting: Diagnostic Neuroimaging

## 2015-09-03 ENCOUNTER — Ambulatory Visit (INDEPENDENT_AMBULATORY_CARE_PROVIDER_SITE_OTHER): Payer: Commercial Managed Care - HMO | Admitting: Diagnostic Neuroimaging

## 2015-09-03 VITALS — BP 119/79 | HR 70 | Ht 69.5 in | Wt 182.0 lb

## 2015-09-03 DIAGNOSIS — I69359 Hemiplegia and hemiparesis following cerebral infarction affecting unspecified side: Secondary | ICD-10-CM | POA: Diagnosis not present

## 2015-09-03 DIAGNOSIS — R569 Unspecified convulsions: Secondary | ICD-10-CM

## 2015-09-03 NOTE — Patient Instructions (Signed)
Thank you for coming to see [place] at [facility]. I hope we have been able to provide you high quality care today.  You may receive a patient satisfaction survey over the next few weeks. We would appreciate your feedback and comments so that we may continue to improve ourselves and the health of our patients.  - continue levetiracetam 1513m twice a day until supply completed; then reduce to 10038mtwice a day   ~~~~~~~~~~~~~~~~~~~~~~~~~~~~~~~~~~~~~~~~~~~~~~~~~~~~~~~~~~~~~~~~~  DR. PENUMALLI'S GUIDE TO HAPPY AND HEALTHY LIVING These are some of my general health and wellness recommendations. Some of them may apply to you better than others. Please use common sense as you try these suggestions and feel free to ask me any questions.   ACTIVITY/FITNESS Mental, social, emotional and physical stimulation are very important for brain and body health. Try learning a new activity (arts, music, language, sports, games).  Keep moving your body to the best of your abilities. You can do this at home, inside or outside, the park, community center, gym or anywhere you like. Consider a physical therapist or personal trainer to get started. Consider the app Sworkit. Fitness trackers such as smart-watches, smart-phones or Fitbits can help as well.   NUTRITION Eat more plants: colorful vegetables, nuts, seeds and berries.  Eat less sugar, salt, preservatives and processed foods.  Avoid toxins such as cigarettes and alcohol.  Drink water when you are thirsty. Warm water with a slice of lemon is an excellent morning drink to start the day.  Consider these websites for more information The Nutrition Source (hthttps://www.henry-hernandez.biz/Precision Nutrition (wwWindowBlog.ch  RELAXATION Consider practicing mindfulness meditation or other relaxation techniques such as deep breathing, prayer, yoga, tai chi, massage. See website mindful.org or  the apps Headspace or Calm to help get started.   SLEEP Try to get at least 7-8+ hours sleep per day. Regular exercise and reduced caffeine will help you sleep better. Practice good sleep hygeine techniques. See website sleep.org for more information.   PLANNING Prepare estate planning, living will, healthcare POA documents. Sometimes this is best planned with the help of an attorney. Theconversationproject.org and agingwithdignity.org are excellent resources.

## 2015-09-03 NOTE — Progress Notes (Signed)
PATIENT: Eugene [name] DOB: April 18, 1961  REASON FOR VISIT: routine follow up for partial seizures, hx of stroke HISTORY FROM: patient  Chief Complaint  Patient presents with  . Partial seizure    rm 6  . Follow-up    3 month    HISTORY OF PRESENT ILLNESS:  UPDATE 09/03/15 (VRP): Since last visit, doing well. No seizures. Still drinking a little beer and smoking some cigarettes. Apparently, PCP checked labs 2 weeks ago, and due to some abnl, his LEV was reduced to  BID and amantadine was added. Patient not sure why.   UPDATE 05/13/15 (VRP): Since last visit, doing well, no seizures. No new events. Tolerating meds.   UPDATE 11/10/14 (VRP): Since last visit, no seizures. Doing water aerobics for past 3 months. Slipped off some stairs last month, accidentally. No major injury fortunately.  UPDATE 05/07/14 (LL):  Since last visit, doing well, no seizures or stroke symptoms.  Having increased difficulty lately lifting right leg.  Seems to be dragging it more. He is tolerating Keppra well, taking 1500 mg bid. Labs done recently at Dr. Mathews Robinsons office were reportedly good. Blood pressure is well controlled, it is 114/73 in office today. He started back smoking cigarettes again, smoking less than half pack a day, knows he needs to quit.  UPDATE 11/11/13 LL: Patient returns for follow up and states he has been doing well, no convulsions since last visit. He reports that he has a tingling sensation in the right parietal area of his head sometimes which he states feels like when he used to smoke crack (which he hasn't done in many years), but no seizure follows. He is tolerating Keppra well, taking 1500 mg bid.   UPDATE 05/24/13: Since last visit, patient had one visit to emergency room for possible seizure versus stroke. Currently patient has been having dental problems, 2 extractions and was placed on antibiotics and pain medication. One week later, patient was on the bus and had fairly sudden  onset feeling as though he might have a partial seizure. Previous partial seizures involve convulsions on the right arm and right leg. Patient sat on the bus but did not go into convulsions. When he stood up he felt weakness in his right leg. Patient went to the hospital for further evaluation. He was admitted under TIA protocol. MRI of the brain was done which showed no acute findings. Patient was discharged on Plavix instead of aspirin. Since that time patient has done well. No further events. He does feel dizziness and has been seeing dark spots, since starting Plavix. He has not missed a seizure medications.   UPDATE 01/14/13: Doing well. Had 1 breakthrogh sz in Aug 2013, when he missed meds. Otherwise doing well.   UPDATE 02/01/12: Had another breakthrough sz (right arm/leg convulsions) in Feb 2013. Had arguement with daughter that evening by phone. Then had sz at 4am, when he woke up. No missed doses. No other factors.   PRIOR HPI: 54 year old right-handed male with history of intracerebral hemorrhage, here for evaluation of syncope versus seizure. The patient was admitted to [facility] in January 2012 for left frontal intracerebral hemorrhage with right upper and lower extremity weakness. He was discharged to rehabilitation in [facility]. He was doing well with improvement of right-sided weakness. On June 20, 2011, he was in the bathroom, shaving, when he developed lightheadedness, called to his sister for help, and fell to the ground. When she came to the bathroom she  found him shaking for approximately one to 2 minutes. He was profusely sweating. No tongue biting or incontinence. After the episode, patient had significant right upper and lower extremity weakness. He was somewhat confused for several hours after the event. He went to the emergency room, had CT scan of the head and was discharged with a diagnosis of syncope. Since that time he has had no further events.      REVIEW OF SYSTEMS: Full 14 system review of systems performed and notable only as per HPI.     ALLERGIES: No Known Allergies  HOME MEDICATIONS: Outpatient Prescriptions Prior to Visit  Medication Sig Dispense Refill  . aspirin EC 81 MG tablet Take 1 tablet (81 mg total) by mouth daily.    . celecoxib (CELEBREX) 200 MG capsule     . levETIRAcetam (KEPPRA) 750 MG tablet Take 2 tablets (1,500 mg total) by mouth 2 (two) times daily. 360 tablet 4  . losartan-hydrochlorothiazide (HYZAAR) 100-25 MG per tablet Take by mouth daily. 100-25 mg    . Multiple Vitamin (MULTIVITAMIN WITH MINERALS) TABS Take 1 tablet by mouth every morning.    . pravastatin (PRAVACHOL) 20 MG tablet Take 20 mg by mouth every morning.    . verapamil (CALAN) 80 MG tablet Take 80 mg by mouth 3 (three) times daily.       No facility-administered medications prior to visit.    PHYSICAL EXAM Filed Vitals:   09/03/15 1455  BP: 119/79  Pulse: 70  Height: 5' 9.5" (1.765 m)  Weight: 182 lb (82.555 kg)   Body mass index is 26.5 kg/(m^2). No exam data present  Generalized: Well developed, in no distress  Neck: Supple, no carotid bruits  Cardiac: Regular rate rhythm, no murmur    Neurological examination  Mentation: Alert oriented to time, place, history taking. Follows all commands speech and language fluent Cranial nerve II-XII: Pupils were equal round reactive to light extraocular movements were full, visual field were full on confrontational test. Facial sensation normal, DECR RIGHT LOWER FACIAL STRENGTH, hearing was intact to finger rubbing bilaterally. Uvula tongue midline. head turning and shoulder shrug and were normal and symmetric.Tongue protrusion into cheek strength was normal. Motor: normal bulk and tone, full strength in the LUE AND LLE. RUE TRICEPS 4, RLE (HF 4, KE 4, KF 3, DF 2).  Sensory: Sensory testing is intact to soft touch on all 4 extremities. No evidence of extinction is noted.  Coordination:  Cerebellar testing reveals good finger-nose-finger bilaterally.  Gait and station: MILD RIGHT HEMIPARETIC GAIT, AFO on right foot.  Reflexes: deep tendon reflexes present and INCREASED ON RIGHT SIDE.      DIAGNOSTIC DATA (LABS, IMAGING, TESTING) - I reviewed patient records, labs, notes, testing and imaging myself where available.  05/04/13 MRI brain (without) - No acute finding. 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 Background pattern of moderate chronic small vessel disease throughout the cerebral hemispheric white matter.   05/05/13 MRA head - normal   05/06/13 carotid u/s - Bilateral: intimal wall thickening CCA. Mild soft plaque origin ICA. 0-39% ICA stenosis. vertebral artery flow is antegrade. ICA/CCA ratio: R-1.39 L-1.0   05/05/13 TTE - No prior study for comparison. Mild basal septal hypertrophy with LVEF 55-60%, mild diastolic dysfunction. No clear evidence of ASD or PFO.    ASSESSMENT: 54 y.o. 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 Now with episode of loss of consciousness, convulsions, postictal right-sided  weakness, postictal confusion on June 20, 2011. Another partial sz in Feb 2013 and Aug 2013. Event in June 2014 may have been partial seizure, seizure aura or TIA. No further episodes since that time. Doing well on LEV  BID.  Now PCP reduced LEV to  BID and added amantadine, due to some "abnormal labs". Will contact PCP to find out more information.  Dx:  Partial seizure (HCC)    PLAN:  1. Continue levetiracetam  twice daily until current supply completed; then may reduce to  BID as rx'd by PCP. 2. Continue Aspirin .  3. Safety and seizure precautions reviewed  4. Requesting records from PCP to review notes  Return in about 3 months (around 12/04/2015).    Suanne Marker, MD 09/03/2015, 3:30 PM Certified in Neurology, Neurophysiology and  Neuroimaging  [facility] [address] [place], [place] [address] [phone]

## 2015-09-03 NOTE — Telephone Encounter (Signed)
Patient states his PCP, Dr Andi DevonKimberly Shelton sent him for this apt due to abnormal labs. Called [facility] and left VM requesting call back to have labs faxed to this office for Dr Penumalli's review.

## 2015-12-04 ENCOUNTER — Ambulatory Visit (INDEPENDENT_AMBULATORY_CARE_PROVIDER_SITE_OTHER): Payer: Commercial Managed Care - HMO | Admitting: Diagnostic Neuroimaging

## 2015-12-04 ENCOUNTER — Encounter: Payer: Self-pay | Admitting: Diagnostic Neuroimaging

## 2015-12-04 VITALS — BP 122/80 | HR 72 | Ht 69.5 in | Wt 183.6 lb

## 2015-12-04 DIAGNOSIS — I69359 Hemiplegia and hemiparesis following cerebral infarction affecting unspecified side: Secondary | ICD-10-CM | POA: Diagnosis not present

## 2015-12-04 DIAGNOSIS — R569 Unspecified convulsions: Secondary | ICD-10-CM | POA: Diagnosis not present

## 2015-12-04 MED ORDER — LEVETIRACETAM 1000 MG PO TABS: 1000.0000 mg | ORAL_TABLET | Freq: Two times a day (BID) | ORAL | Status: DC

## 2015-12-04 NOTE — Progress Notes (Signed)
PATIENT: Eugene [name] DOB: 08/19/1961  REASON FOR VISIT: routine follow up for partial seizures, hx of stroke HISTORY FROM: patient  Chief Complaint  Patient presents with  . Follow-up    HISTORY OF PRESENT ILLNESS:  UPDATE 12/04/15: Since last visit, doing well. No new events. No sz. Tolerating meds.  UPDATE 09/03/15 (VRP): Since last visit, doing well. No seizures. Still drinking a little beer and smoking some cigarettes. Apparently, PCP checked labs 2 weeks ago, and due to some abnl, his LEV was reduced to  BID and amantadine was added. Patient not sure why.   UPDATE 05/13/15 (VRP): Since last visit, doing well, no seizures. No new events. Tolerating meds.   UPDATE 11/10/14 (VRP): Since last visit, no seizures. Doing water aerobics for past 3 months. Slipped off some stairs last month, accidentally. No major injury fortunately.  UPDATE 05/07/14 (LL):  Since last visit, doing well, no seizures or stroke symptoms.  Having increased difficulty lately lifting right leg.  Seems to be dragging it more. He is tolerating Keppra well, taking 1500 mg bid. Labs done recently at Dr. Mathews Robinsons office were reportedly good. Blood pressure is well controlled, it is 114/73 in office today. He started back smoking cigarettes again, smoking less than half pack a day, knows he needs to quit.  UPDATE 11/11/13 LL: Patient returns for follow up and states he has been doing well, no convulsions since last visit. He reports that he has a tingling sensation in the right parietal area of his head sometimes which he states feels like when he used to smoke crack (which he hasn't done in many years), but no seizure follows. He is tolerating Keppra well, taking 1500 mg bid.   UPDATE 05/24/13: Since last visit, patient had one visit to emergency room for possible seizure versus stroke. Currently patient has been having dental problems, 2 extractions and was placed on antibiotics and pain medication. One week later,  patient was on the bus and had fairly sudden onset feeling as though he might have a partial seizure. Previous partial seizures involve convulsions on the right arm and right leg. Patient sat on the bus but did not go into convulsions. When he stood up he felt weakness in his right leg. Patient went to the hospital for further evaluation. He was admitted under TIA protocol. MRI of the brain was done which showed no acute findings. Patient was discharged on Plavix instead of aspirin. Since that time patient has done well. No further events. He does feel dizziness and has been seeing dark spots, since starting Plavix. He has not missed a seizure medications.   UPDATE 01/14/13: Doing well. Had 1 breakthrogh sz in Aug 2013, when he missed meds. Otherwise doing well.   UPDATE 02/01/12: Had another breakthrough sz (right arm/leg convulsions) in Feb 2013. Had arguement with daughter that evening by phone. Then had sz at 4am, when he woke up. No missed doses. No other factors.   PRIOR HPI: 55 year old right-handed male with history of intracerebral hemorrhage, here for evaluation of syncope versus seizure. The patient was admitted to [facility] in January 2012 for left frontal intracerebral hemorrhage with right upper and lower extremity weakness. He was discharged to rehabilitation in [facility]. He was doing well with improvement of right-sided weakness. On June 20, 2011, he was in the bathroom, shaving, when he developed lightheadedness, called to his sister for help, and fell to the ground. When she came to the bathroom  she found him shaking for approximately one to 2 minutes. He was profusely sweating. No tongue biting or incontinence. After the episode, patient had significant right upper and lower extremity weakness. He was somewhat confused for several hours after the event. He went to the emergency room, had CT scan of the head and was discharged with a diagnosis of syncope. Since that  time he has had no further events.    REVIEW OF SYSTEMS: Full 14 system review of systems performed and notable only as per HPI.     ALLERGIES: No Known Allergies  HOME MEDICATIONS: Outpatient Prescriptions Prior to Visit  Medication Sig Dispense Refill  . aspirin EC 81 MG tablet Take 1 tablet (81 mg total) by mouth daily.    Marland Kitchen levETIRAcetam (KEPPRA) 1000 MG tablet 1,000 mg.    . losartan-hydrochlorothiazide (HYZAAR) 100-25 MG per tablet Take by mouth daily. 100-25 mg    . Multiple Vitamin (MULTIVITAMIN WITH MINERALS) TABS Take 1 tablet by mouth every morning.    . pravastatin (PRAVACHOL) 20 MG tablet Take 20 mg by mouth every morning.    . verapamil (CALAN) 80 MG tablet Take 80 mg by mouth 3 (three) times daily.      . fluticasone (FLONASE) 50 MCG/ACT nasal spray Reported on 12/04/2015    . Amantadine HCl 100 MG tablet 100 mg.    . celecoxib (CELEBREX) 200 MG capsule     . levETIRAcetam (KEPPRA) 750 MG tablet Take 2 tablets (1,500 mg total) by mouth 2 (two) times daily. 360 tablet 4   No facility-administered medications prior to visit.    PHYSICAL EXAM Filed Vitals:   12/04/15 1126  BP: 122/80  Pulse: 72  Height: 5' 9.5" (1.765 m)  Weight: 183 lb 9.6 oz (83.28 kg)   Body mass index is 26.73 kg/(m^2). No exam data present  Generalized: Well developed, in no distress  Neck: Supple, no carotid bruits  Cardiac: Regular rate rhythm, no murmur   Neurological examination  Mentation: Alert oriented to time, place, history taking. Follows all commands speech and language fluent Cranial nerve II-XII: Pupils were equal round reactive to light, extraocular movements were full, visual field were full on confrontational test. Facial sensation normal, DECR RIGHT LOWER FACIAL STRENGTH, hearing was intact to finger rubbing bilaterally. Uvula tongue midline. head turning and shoulder shrug and were normal and symmetric.Tongue protrusion into cheek strength was normal. Motor: normal bulk  and tone, full strength in the BUE AND LLE. RLE (HF 4, KE 4, KF 3, DF 2).  Sensory: Sensory testing is intact to soft touch on all 4 extremities.  Coordination: Cerebellar testing reveals good finger-nose-finger bilaterally.  Gait and station: MILD RIGHT HEMIPARETIC GAIT, AFO on right foot.  Reflexes: deep tendon reflexes present and INCREASED ON RIGHT SIDE.       DIAGNOSTIC DATA (LABS, IMAGING, TESTING)  05/04/13 MRI brain (without) - No acute finding. 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 Background pattern of moderate chronic small vessel disease throughout the cerebral hemispheric white matter.   05/05/13 MRA head - normal   05/06/13 carotid u/s - Bilateral: intimal wall thickening CCA. Mild soft plaque origin ICA. 0-39% ICA stenosis. vertebral artery flow is antegrade. ICA/CCA ratio: R-1.39 L-1.0   05/05/13 TTE - No prior study for comparison. Mild basal septal hypertrophy with LVEF 55-60%, mild diastolic dysfunction. No clear evidence of ASD or PFO.    ASSESSMENT: 54 y.o. male with history of left frontal intracerebral hemorrhage in the setting of  cocaine use, in January 2012. Now with episode of loss of consciousness, convulsions, postictal right-sided weakness, postictal confusion on June 20, 2011. Another partial sz in Feb 2013 and Aug 2013. Event in June 2014 may have been partial seizure, seizure aura or TIA. No further episodes since that time. Was doing well on LEV  BID. Then PCP reduced LEV to  BID and added amantadine, due to some "abnormal labs". Overall stable.  Dx:  History of hemorrhagic stroke with residual hemiparesis (HCC)  Partial seizure (HCC)    PLAN:  1. Continue levetiracetam  BID (refills via PCP) 2. Continue Aspirin .  3. Safety and seizure precautions reviewed   Meds ordered this encounter  Medications  . levETIRAcetam (KEPPRA) 1000 MG tablet    Sig: Take 1 tablet (1,000 mg total) by mouth 2 (two) times  daily.    Dispense:  180 tablet    Refill:  4   Return in about 6 months (around 06/02/2016).    Suanne Marker, MD 12/04/2015, 12:01 PM Certified in Neurology, Neurophysiology and Neuroimaging  [facility] [address], [address] [place], [place] [address] [phone]

## 2015-12-04 NOTE — Patient Instructions (Signed)
-  continue current medications

## 2016-01-21 ENCOUNTER — Telehealth: Payer: Self-pay | Admitting: Diagnostic Neuroimaging

## 2016-01-21 NOTE — Telephone Encounter (Signed)
Spoke with Misty StanleyLisa at Dr Kris MoutonK Shelton's office, pt's listed PCP. Misty StanleyLisa stated patient in not in their data base. She suggested calling [facility]. LVM for [facility], requested call back.

## 2016-01-21 NOTE — Telephone Encounter (Signed)
Pt is request that this office refill verapamil (CALAN) 80 MG tablet. Pt states his pcp has moved and needs a refill. Pt was told that this office may not refill it and he may need to find another pcp.

## 2016-01-21 NOTE — Telephone Encounter (Signed)
Received call back from [facility]; was unable to accept call at that time. Left second VM requesting call back.

## 2016-01-22 NOTE — Telephone Encounter (Signed)
LVM for [facility], requested call back today by 12 noon.

## 2016-01-22 NOTE — Telephone Encounter (Signed)
Spoke with Olen PelKatawba at [facility]. She stated the office is familiar with patient and will refill his verapamil. She stated she will call pt to inform. She verbalized understanding of call.

## 2016-05-16 ENCOUNTER — Ambulatory Visit (INDEPENDENT_AMBULATORY_CARE_PROVIDER_SITE_OTHER): Payer: Medicare Other | Admitting: Diagnostic Neuroimaging

## 2016-05-16 ENCOUNTER — Encounter: Payer: Self-pay | Admitting: Diagnostic Neuroimaging

## 2016-05-16 VITALS — BP 116/76 | HR 57 | Ht 69.5 in | Wt 180.0 lb

## 2016-05-16 DIAGNOSIS — R569 Unspecified convulsions: Secondary | ICD-10-CM | POA: Diagnosis not present

## 2016-05-16 DIAGNOSIS — I69359 Hemiplegia and hemiparesis following cerebral infarction affecting unspecified side: Secondary | ICD-10-CM

## 2016-05-16 MED ORDER — LEVETIRACETAM 1000 MG PO TABS: 1000.0000 mg | ORAL_TABLET | Freq: Two times a day (BID) | ORAL | Status: DC

## 2016-05-16 NOTE — Patient Instructions (Signed)
-   continue levetiracetam 1000mg twice a day 

## 2016-05-16 NOTE — Progress Notes (Signed)
PATIENT: Eugene [name] DOB: [birth year]  REASON FOR VISIT: routine follow up for partial seizures, hx of stroke HISTORY FROM: patient  Chief Complaint  Patient presents with  . History hemorrhagic stroke, residual hemiparesis    rm 7, " weakness in right arm w/lifting x a few days, may be due to [facility]hum[name]; I feel pretty fine other than that"  . Follow-up    6 month    HISTORY OF PRESENT ILLNESS:  UPDATE 05/16/16: Since last visit, doing well. No sz. Tolerating LEV. Some right arm weakness when overheated. Some balance diff when not wearing brace in summertime (too hot to wear).   UPDATE 12/04/15: Since last visit, doing well. No new events. No sz. Tolerating meds.  UPDATE 09/03/15 (VRP): Since last visit, doing well. No seizures. Still drinking a little beer and smoking some cigarettes. Apparently, PCP checked labs 2 weeks ago, and due to some abnl, his LEV was reduced to 1000mg  BID and amantadine was added. Patient not sure why.   UPDATE 05/13/15 (VRP): Since last visit, doing well, no seizures. No new events. Tolerating meds.   UPDATE 11/10/14 (VRP): Since last visit, no seizures. Doing water aerobics for past 3 months. Slipped off some stairs last month, accidentally. No major injury fortunately.  UPDATE 05/07/14 (LL):  Since last visit, doing well, no seizures or stroke symptoms.  Having increased difficulty lately lifting right leg.  Seems to be dragging it more. He is tolerating Keppra well, taking 1500 mg bid. Labs done recently at Dr. Mathews RobinsonsShelton's office were reportedly good. Blood pressure is well controlled, it is 114/73 in office today. He started back smoking cigarettes again, smoking less than half pack a day, knows he needs to quit.  UPDATE 11/11/13 LL: Patient returns for follow up and states he has been doing well, no convulsions since last visit. He reports that he has a tingling sensation in the right parietal area of his head sometimes which he states feels like  when he used to smoke crack (which he hasn't done in many years), but no seizure follows. He is tolerating Keppra well, taking 1500 mg bid.   UPDATE 05/24/13: Since last visit, patient had one visit to emergency room for possible seizure versus stroke. Currently patient has been having dental problems, 2 extractions and was placed on antibiotics and pain medication. One week later, patient was on the bus and had fairly sudden onset feeling as though he might have a partial seizure. Previous partial seizures involve convulsions on the right arm and right leg. Patient sat on the bus but did not go into convulsions. When he stood up he felt weakness in his right leg. Patient went to the hospital for further evaluation. He was admitted under TIA protocol. MRI of the brain was done which showed no acute findings. Patient was discharged on Plavix instead of aspirin. Since that time patient has done well. No further events. He does feel dizziness and has been seeing dark spots, since starting Plavix. He has not missed a seizure medications.   UPDATE 01/14/13: Doing well. Had 1 breakthrogh sz in Aug 2013, when he missed meds. Otherwise doing well.   UPDATE 02/01/12: Had another breakthrough sz (right arm/leg convulsions) in Feb 2013. Had arguement with daughter that evening by phone. Then had sz at 4am, when he woke up. No missed doses. No other factors.   PRIOR HPI: 55 year old right-handed male with history of intracerebral hemorrhage, here for evaluation of syncope versus seizure.  The patient was admitted to [facility] in January 2012 for left frontal intracerebral hemorrhage with right upper and lower extremity weakness. He was discharged to rehabilitation in [facility]. He was doing well with improvement of right-sided weakness. On June 20, 2011, he was in the bathroom, shaving, when he developed lightheadedness, called to his sister for help, and fell to the ground. When she came to the  bathroom she found him shaking for approximately one to 2 minutes. He was profusely sweating. No tongue biting or incontinence. After the episode, patient had significant right upper and lower extremity weakness. He was somewhat confused for several hours after the event. He went to the emergency room, had CT scan of the head and was discharged with a diagnosis of syncope. Since that time he has had no further events.    REVIEW OF SYSTEMS: Full 14 system review of systems performed and negative except weakness in right arm with fatigue and heat; mild walking diff.    ALLERGIES: No Known Allergies  HOME MEDICATIONS: Outpatient Prescriptions Prior to Visit  Medication Sig Dispense Refill  . aspirin EC 81 MG tablet Take 1 tablet (81 mg total) by mouth daily.    . fluticasone (FLONASE) 50 MCG/ACT nasal spray Reported on 12/04/2015    . levETIRAcetam (KEPPRA) 1000 MG tablet Take 1 tablet (1,000 mg total) by mouth 2 (two) times daily. 180 tablet 4  . losartan-hydrochlorothiazide (HYZAAR) 100-25 MG per tablet Take by mouth daily. 100-25 mg    . Multiple Vitamin (MULTIVITAMIN WITH MINERALS) TABS Take 1 tablet by mouth every morning.    . pravastatin (PRAVACHOL) 20 MG tablet Take 20 mg by mouth every morning.    . verapamil (CALAN) 80 MG tablet Take 80 mg by mouth 3 (three) times daily.       No facility-administered medications prior to visit.    PHYSICAL EXAM Filed Vitals:   05/16/16 0932  BP: 116/76  Pulse: 57  Height: 5' 9.5" (1.765 m)  Weight: 180 lb (81.647 kg)   Body mass index is 26.21 kg/(m^2). No exam data present  Generalized: Well developed, in no distress  Neck: Supple, no carotid bruits  Cardiac: Regular rate rhythm, no murmur   Neurological examination  Mentation: Alert oriented to time, place, history taking. Follows all commands speech and language fluent Cranial nerve II-XII: Pupils were equal round reactive to light, extraocular movements were full, visual field were  full on confrontational test. Facial sensation normal, DECR RIGHT LOWER FACIAL STRENGTH, hearing was intact to finger rubbing bilaterally. Uvula tongue midline. head turning and shoulder shrug and were normal and symmetric.Tongue protrusion into cheek strength was normal. Motor: normal bulk and tone, full strength in the BUE AND LLE. RLE (HF 4, KE 4, KF 3, DF 2).  Sensory: Sensory testing is intact to soft touch on all 4 extremities.  Coordination: Cerebellar testing reveals good finger-nose-finger bilaterally.  Gait and station: MILD RIGHT HEMIPARETIC GAIT, AFO on right foot.  Reflexes: deep tendon reflexes present and INCREASED ON RIGHT SIDE.       DIAGNOSTIC DATA (LABS, IMAGING, TESTING)  05/04/13 MRI brain (without) - No acute finding. 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 Background pattern of moderate chronic small vessel disease throughout the cerebral hemispheric white matter.   05/05/13 MRA head - normal   05/06/13 carotid u/s - Bilateral: intimal wall thickening CCA. Mild soft plaque origin ICA. 0-39% ICA stenosis. vertebral artery flow is antegrade. ICA/CCA ratio: R-1.39 L-1.0  05/05/13 TTE - No prior study for comparison. Mild basal septal hypertrophy with LVEF 55-60%, mild diastolic dysfunction. No clear evidence of ASD or PFO.    ASSESSMENT: 55 y.o. 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 Now with episode of loss of consciousness, convulsions, postictal right-sided weakness, postictal confusion on June 20, 2011. Another partial sz in Feb 2013 and Aug 2013. Event in June 2014 may have been partial seizure, seizure aura or TIA. No further episodes since that time. Was doing well on LEV  BID. Then PCP reduced LEV to  BID and added amantadine, due to some "abnormal labs". Overall stable.   Dx:  Partial seizure (HCC)  History of hemorrhagic stroke with residual hemiparesis  (HCC)    PLAN:  1. Continue levetiracetam  BID (refills via PCP) 2. Continue Aspirin .  3. Safety and seizure precautions reviewed   Meds ordered this encounter  Medications  . levETIRAcetam (KEPPRA) 1000 MG tablet    Sig: Take 1 tablet (1,000 mg total) by mouth 2 (two) times daily.    Dispense:  180 tablet    Refill:  4   Return in about 1 year (around 05/16/2017).     Suanne Marker, MD 05/16/2016, 10:10 AM Certified in Neurology, Neurophysiology and Neuroimaging  [facility] [address] [place], [place] [address] [phone]

## 2016-08-12 DIAGNOSIS — E782 Mixed hyperlipidemia: Secondary | ICD-10-CM | POA: Diagnosis not present

## 2016-08-12 DIAGNOSIS — I10 Essential (primary) hypertension: Secondary | ICD-10-CM | POA: Diagnosis not present

## 2016-08-12 DIAGNOSIS — Z1211 Encounter for screening for malignant neoplasm of colon: Secondary | ICD-10-CM | POA: Diagnosis not present

## 2016-08-12 DIAGNOSIS — Z0000 Encounter for general adult medical examination without abnormal findings: Secondary | ICD-10-CM | POA: Diagnosis not present

## 2016-08-12 DIAGNOSIS — Z23 Encounter for immunization: Secondary | ICD-10-CM | POA: Diagnosis not present

## 2016-10-12 DIAGNOSIS — R413 Other amnesia: Secondary | ICD-10-CM | POA: Diagnosis not present

## 2016-10-12 DIAGNOSIS — Z8673 Personal history of transient ischemic attack (TIA), and cerebral infarction without residual deficits: Secondary | ICD-10-CM | POA: Diagnosis not present

## 2016-10-12 DIAGNOSIS — Z720 Tobacco use: Secondary | ICD-10-CM | POA: Diagnosis not present

## 2017-02-23 DIAGNOSIS — Z79899 Other long term (current) drug therapy: Secondary | ICD-10-CM | POA: Diagnosis not present

## 2017-02-23 DIAGNOSIS — I10 Essential (primary) hypertension: Secondary | ICD-10-CM | POA: Diagnosis not present

## 2017-02-23 DIAGNOSIS — E782 Mixed hyperlipidemia: Secondary | ICD-10-CM | POA: Diagnosis not present

## 2017-03-27 ENCOUNTER — Emergency Department (HOSPITAL_COMMUNITY): Payer: Medicare Other

## 2017-03-27 ENCOUNTER — Encounter (HOSPITAL_COMMUNITY): Payer: Self-pay | Admitting: Emergency Medicine

## 2017-03-27 ENCOUNTER — Inpatient Hospital Stay (HOSPITAL_COMMUNITY)
Admission: EM | Admit: 2017-03-27 | Discharge: 2017-03-30 | DRG: 493 | Disposition: A | Discharge status: Skilled Nursing Facility | Payer: Medicare Other | Attending: Orthopedic Surgery | Admitting: Orthopedic Surgery

## 2017-03-27 DIAGNOSIS — F17210 Nicotine dependence, cigarettes, uncomplicated: Secondary | ICD-10-CM | POA: Diagnosis present

## 2017-03-27 DIAGNOSIS — S82142A Displaced bicondylar fracture of left tibia, initial encounter for closed fracture: Principal | ICD-10-CM | POA: Diagnosis present

## 2017-03-27 DIAGNOSIS — Z79899 Other long term (current) drug therapy: Secondary | ICD-10-CM | POA: Diagnosis not present

## 2017-03-27 DIAGNOSIS — I10 Essential (primary) hypertension: Secondary | ICD-10-CM | POA: Diagnosis not present

## 2017-03-27 DIAGNOSIS — Z7982 Long term (current) use of aspirin: Secondary | ICD-10-CM | POA: Diagnosis not present

## 2017-03-27 DIAGNOSIS — R569 Unspecified convulsions: Secondary | ICD-10-CM | POA: Diagnosis not present

## 2017-03-27 DIAGNOSIS — W01190A Fall on same level from slipping, tripping and stumbling with subsequent striking against furniture, initial encounter: Secondary | ICD-10-CM | POA: Diagnosis present

## 2017-03-27 DIAGNOSIS — Z6828 Body mass index (BMI) 28.0-28.9, adult: Secondary | ICD-10-CM

## 2017-03-27 DIAGNOSIS — I69351 Hemiplegia and hemiparesis following cerebral infarction affecting right dominant side: Secondary | ICD-10-CM | POA: Diagnosis not present

## 2017-03-27 DIAGNOSIS — S82145A Nondisplaced bicondylar fracture of left tibia, initial encounter for closed fracture: Secondary | ICD-10-CM | POA: Diagnosis not present

## 2017-03-27 DIAGNOSIS — M25561 Pain in right knee: Secondary | ICD-10-CM | POA: Diagnosis not present

## 2017-03-27 DIAGNOSIS — M6281 Muscle weakness (generalized): Secondary | ICD-10-CM | POA: Diagnosis not present

## 2017-03-27 DIAGNOSIS — R262 Difficulty in walking, not elsewhere classified: Secondary | ICD-10-CM | POA: Diagnosis not present

## 2017-03-27 DIAGNOSIS — E663 Overweight: Secondary | ICD-10-CM | POA: Diagnosis present

## 2017-03-27 DIAGNOSIS — Z9181 History of falling: Secondary | ICD-10-CM | POA: Diagnosis not present

## 2017-03-27 DIAGNOSIS — E876 Hypokalemia: Secondary | ICD-10-CM | POA: Diagnosis not present

## 2017-03-27 DIAGNOSIS — Y92009 Unspecified place in unspecified non-institutional (private) residence as the place of occurrence of the external cause: Secondary | ICD-10-CM | POA: Diagnosis not present

## 2017-03-27 DIAGNOSIS — D696 Thrombocytopenia, unspecified: Secondary | ICD-10-CM | POA: Diagnosis not present

## 2017-03-27 DIAGNOSIS — M25562 Pain in left knee: Secondary | ICD-10-CM | POA: Diagnosis present

## 2017-03-27 DIAGNOSIS — T148XXA Other injury of unspecified body region, initial encounter: Secondary | ICD-10-CM

## 2017-03-27 DIAGNOSIS — S82122A Displaced fracture of lateral condyle of left tibia, initial encounter for closed fracture: Secondary | ICD-10-CM | POA: Diagnosis not present

## 2017-03-27 DIAGNOSIS — S82142D Displaced bicondylar fracture of left tibia, subsequent encounter for closed fracture with routine healing: Secondary | ICD-10-CM | POA: Diagnosis not present

## 2017-03-27 LAB — CBC WITH DIFFERENTIAL/PLATELET
Basophils Absolute: 0 10*3/uL (ref 0.0–0.1)
Basophils Relative: 0 %
EOS PCT: 0 %
Eosinophils Absolute: 0 10*3/uL (ref 0.0–0.7)
HCT: 35 % — ABNORMAL LOW (ref 39.0–52.0)
HEMOGLOBIN: 12.9 g/dL — AB (ref 13.0–17.0)
LYMPHS ABS: 2 10*3/uL (ref 0.7–4.0)
LYMPHS PCT: 12 %
MCH: 30.4 pg (ref 26.0–34.0)
MCHC: 36.9 g/dL — ABNORMAL HIGH (ref 30.0–36.0)
MCV: 82.5 fL (ref 78.0–100.0)
Monocytes Absolute: 1.5 10*3/uL — ABNORMAL HIGH (ref 0.1–1.0)
Monocytes Relative: 9 %
Neutro Abs: 12.8 10*3/uL — ABNORMAL HIGH (ref 1.7–7.7)
Neutrophils Relative %: 78 %
PLATELETS: 130 10*3/uL — AB (ref 150–400)
RBC: 4.24 MIL/uL (ref 4.22–5.81)
RDW: 18.5 % — ABNORMAL HIGH (ref 11.5–15.5)
WBC: 16.4 10*3/uL — AB (ref 4.0–10.5)

## 2017-03-27 LAB — COMPREHENSIVE METABOLIC PANEL
ALK PHOS: 58 U/L (ref 38–126)
ALT: 17 U/L (ref 17–63)
AST: 37 U/L (ref 15–41)
Albumin: 4.2 g/dL (ref 3.5–5.0)
Anion gap: 9 (ref 5–15)
BUN: 14 mg/dL (ref 6–20)
CALCIUM: 9.4 mg/dL (ref 8.9–10.3)
CO2: 26 mmol/L (ref 22–32)
Chloride: 103 mmol/L (ref 101–111)
Creatinine, Ser: 0.94 mg/dL (ref 0.61–1.24)
GLUCOSE: 105 mg/dL — AB (ref 65–99)
Potassium: 2.9 mmol/L — ABNORMAL LOW (ref 3.5–5.1)
Sodium: 138 mmol/L (ref 135–145)
TOTAL PROTEIN: 7.4 g/dL (ref 6.5–8.1)
Total Bilirubin: 1.6 mg/dL — ABNORMAL HIGH (ref 0.3–1.2)

## 2017-03-27 MED ORDER — LEVETIRACETAM 500 MG PO TABS
1000.0000 mg | ORAL_TABLET | Freq: Once | ORAL | Status: AC
  Administered 2017-03-27: 1000 mg via ORAL
  Filled 2017-03-27: qty 2

## 2017-03-27 MED ORDER — MORPHINE SULFATE (PF) 4 MG/ML IV SOLN
4.0000 mg | Freq: Once | INTRAVENOUS | Status: AC
  Administered 2017-03-27: 4 mg via INTRAVENOUS
  Filled 2017-03-27: qty 1

## 2017-03-27 MED ORDER — OXYCODONE-ACETAMINOPHEN 5-325 MG PO TABS
2.0000 | ORAL_TABLET | Freq: Once | ORAL | Status: AC
Start: 2017-03-27 — End: 2017-03-27
  Administered 2017-03-27: 2 via ORAL
  Filled 2017-03-27: qty 2

## 2017-03-27 MED ORDER — POTASSIUM CHLORIDE CRYS ER 20 MEQ PO TBCR
40.0000 meq | EXTENDED_RELEASE_TABLET | Freq: Once | ORAL | Status: AC
  Administered 2017-03-27: 40 meq via ORAL
  Filled 2017-03-27: qty 2

## 2017-03-27 MED ORDER — POTASSIUM CHLORIDE 10 MEQ/100ML IV SOLN
10.0000 meq | Freq: Once | INTRAVENOUS | Status: AC
  Administered 2017-03-27: 10 meq via INTRAVENOUS
  Filled 2017-03-27: qty 100

## 2017-03-27 NOTE — ED Notes (Signed)
Called to give reports floor RN states that they are awaiting a RN to come, to hold patient until staffer is avaliable and that they would call when she was there.

## 2017-03-27 NOTE — ED Notes (Signed)
Pt is alert and orineted x 4 and is verbally responsive.Pt states that he had a fall today and hit a table and injured his left knee with aching pain 10/10 pt has left knee swelling, pt reports that he has had a stroke in the past hand has right side weakness, and constitutes  this as the reason of falling. Pt denies dizziness, and LOC. Pt reports just losing his balance.

## 2017-03-27 NOTE — ED Provider Notes (Addendum)
[facility]-EMERGENCY DEPT Provider Note   CSN: [id number] Arrival date & time: 03/27/17  1701     History   Chief Complaint Chief Complaint  Patient presents with  . Knee Pain    HPI Eugene [name] is a 56 y.o. male history of hypertension, previous stroke with residual right-sided weakness, here with fall, L knee pain. Patient states that he has right-sided weakness at baseline and wears a brace at baseline and does not bear much weight on the right side. Patient states that he was walking at home yesterday and tripped and hit the left knee on a table. He states that he is trying to walk on it today and has progressive left knee pain and swelling. Denies syncope or head injury or other injuries.   The history is provided by the patient.    Past Medical History:  Diagnosis Date  . Hypertension   . Seizures (HCC)   . Stroke [facility](HCC)     Patient Active Problem List   Diagnosis Date Noted  . Current smoker 05/07/2014  . Partial seizure (HCC) 05/24/2013  . TIA (transient ischemic attack) 05/04/2013  . History of hemorrhagic stroke with residual hemiparesis (HCC) 05/04/2013  . Hypertension 05/04/2013  . History of drug abuse 05/04/2013  . Thrombocytopenia (HCC) 05/04/2013    Past Surgical History:  Procedure Laterality Date  . HERNIA REPAIR         Home Medications    Prior to Admission medications   Medication Sig Start Date End Date Taking? Authorizing Provider  aspirin EC 81 MG tablet Take 1 tablet (81 mg total) by mouth daily. Patient taking differently: Take 81 mg by mouth every morning.  11/11/13  Yes Ronal FearLam, Lynn E, NP  levETIRAcetam (KEPPRA) 1000 MG tablet Take 1 tablet (1,000 mg total) by mouth 2 (two) times daily. 05/16/16  Yes Penumalli, Glenford BayleyVikram R, MD  losartan-hydrochlorothiazide (HYZAAR) 100-25 MG per tablet Take 1 tablet by mouth every morning.  10/08/13  Yes [provider]  Multiple Vitamin (MULTIVITAMIN WITH MINERALS) TABS Take 1 tablet by mouth every  morning.   Yes [provider]  pravastatin (PRAVACHOL) 20 MG tablet Take 20 mg by mouth every morning.   Yes [provider]  verapamil (CALAN) 80 MG tablet Take 80 mg by mouth 3 (three) times daily.     Yes [provider]    Family History Family History  Problem Relation Age of Onset  . Asthma Mother   . Diabetes Mother   . Other Father        natural causes    Social History Social History  Substance Use Topics  . Smoking status: Current Every Day Smoker    Packs/day: 0.50    Years: 20.00    Types: Cigarettes  . Smokeless tobacco: Never Used     Comment: states he smokes maybe 2-3 cigarettes a day  . Alcohol use 0.6 oz/week    1 Cans of beer per week     Allergies   Patient has no known allergies.   Review of Systems Review of Systems  Musculoskeletal:       L knee pain   All other systems reviewed and are negative.    Physical Exam Updated Vital Signs BP 127/81 (BP Location: Left Arm)   Pulse 82   Resp 18   Ht 5\' 7"  (1.702 m)   Wt 81.6 kg (180 lb)   SpO2 99%   BMI 28.19 kg/m   Physical Exam  Constitutional:  Chronically ill   HENT:  Head: Normocephalic.  Mouth/Throat: Oropharynx is clear and moist.  Eyes: EOM are normal. Pupils are equal, round, and reactive to light.  Neck: Normal range of motion. Neck supple.  Cardiovascular: Normal rate, regular rhythm and normal heart sounds.   Pulmonary/Chest: Effort normal and breath sounds normal.  Abdominal: Soft. Bowel sounds are normal.  Musculoskeletal:  L knee swollen with effusion, dec ROM. Mild proximal tibial tenderness. No hip tenderness. No lower tib/fib or ankle tenderness   Neurological: He is alert.  Skin: Skin is warm.  Nursing note and vitals reviewed.    ED Treatments / Results  Labs (all labs ordered are listed, but only abnormal results are displayed) Labs Reviewed  CBC WITH DIFFERENTIAL/PLATELET - Abnormal; Notable for the following:       Result  Value   WBC 16.4 (*)    Hemoglobin 12.9 (*)    HCT 35.0 (*)    MCHC 36.9 (*)    RDW 18.5 (*)    Platelets 130 (*)    Neutro Abs 12.8 (*)    Monocytes Absolute 1.5 (*)    All other components within normal limits  COMPREHENSIVE METABOLIC PANEL - Abnormal; Notable for the following:    Potassium 2.9 (*)    Glucose, Bld 105 (*)    Total Bilirubin 1.6 (*)    All other components within normal limits    EKG  EKG Interpretation None       Radiology Dg Knee Complete 4 Views Left  Result Date: 03/27/2017 CLINICAL DATA:  RIGHT knee pain and swelling for 1 day after slipping on a wet floor at home last night EXAM: LEFT KNEE - COMPLETE 4+ VIEW COMPARISON:  None FINDINGS: Osseous demineralization. Scattered joint space narrowing especially lateral compartment. Mildly displaced oblique intra-articular fracture of the lateral tibial plateau. Associated joint effusion and soft tissue swelling. Nondisplaced fracture at fibular neck. No additional fracture, dislocation, or bone destruction. IMPRESSION: Fractures of the lateral LEFT tibial plateau and the LEFT fibular neck as above with associated knee joint effusion and soft tissue swelling. Underlying osseous demineralization and degenerative changes LEFT knee. Electronically Signed   By: Ulyses Southward M.D.   On: 03/27/2017 18:19    Procedures Procedures (including critical care time)  Medications Ordered in ED Medications  potassium chloride SA (K-DUR,KLOR-CON) CR tablet 40 mEq (not administered)  potassium chloride 10 mEq in 100 mL IVPB (not administered)  oxyCODONE-acetaminophen (PERCOCET/ROXICET) 5-325 MG per tablet 2 tablet (2 tablets Oral Given 03/27/17 1954)  levETIRAcetam (KEPPRA) tablet 1,000 mg (1,000 mg Oral Given 03/27/17 1954)  morphine 4 MG/ML injection 4 mg (4 mg Intravenous Given 03/27/17 2021)     Initial Impression / Assessment and Plan / ED Course  I have reviewed the triage vital signs and the nursing notes.  Pertinent  labs & imaging results that were available during my care of the patient were reviewed by me and considered in my medical decision making (see chart for details).     Eugene [name] is a 56 y.o. male here with L knee pain after fall. L knee swollen, concerned for possible knee fracture vs tibial plateau fracture. Will get xrays.   8:30 pm Xray showed tibial plateau fracture. I called Dr. Charlann Boxer from ortho. He will admit overnight and recommend pain meds, knee immobilizer. He will do surgery tomorrow. Of note, labs showed K 2.9, supplemented. WBC 16 likely stress from the fracture, no signs of infection.    Final Clinical  Impressions(s) / ED Diagnoses   Final diagnoses:  None    New Prescriptions New Prescriptions   No medications on file     Charlynne Pander, MD 03/27/17 2102    Charlynne Pander, MD 03/27/17 2105

## 2017-03-27 NOTE — ED Triage Notes (Addendum)
Pt complaint of left knee pain/swelling post fall yesterday; "have to use walker to get around."

## 2017-03-27 NOTE — Progress Notes (Signed)
Called ED back to get report at 2312 and was immediately placed on hold for 3 minutes. Was then asked whom I was holding for. Explained I was calling for report. Was then on hold another 4 minutes.  Will just wait for ED RN to call back.

## 2017-03-27 NOTE — Progress Notes (Signed)
Called to get report at 2251.  Was left on hold for over 5 minutes.  Will call back again if have not heard from ED by 2310.

## 2017-03-28 ENCOUNTER — Inpatient Hospital Stay (HOSPITAL_COMMUNITY): Payer: Medicare Other | Admitting: Anesthesiology

## 2017-03-28 ENCOUNTER — Encounter (HOSPITAL_COMMUNITY)
Admission: EM | Disposition: A | Discharge status: Skilled Nursing Facility | Payer: Self-pay | Source: Home / Self Care | Attending: Orthopedic Surgery

## 2017-03-28 ENCOUNTER — Encounter (HOSPITAL_COMMUNITY): Payer: Self-pay | Admitting: Anesthesiology

## 2017-03-28 ENCOUNTER — Inpatient Hospital Stay (HOSPITAL_COMMUNITY): Payer: Medicare Other

## 2017-03-28 HISTORY — PX: ORIF TIBIA PLATEAU: SHX2132

## 2017-03-28 LAB — CBC
HCT: 33.3 % — ABNORMAL LOW (ref 39.0–52.0)
HEMOGLOBIN: 12.2 g/dL — AB (ref 13.0–17.0)
MCH: 30.9 pg (ref 26.0–34.0)
MCHC: 36.6 g/dL — ABNORMAL HIGH (ref 30.0–36.0)
MCV: 84.3 fL (ref 78.0–100.0)
Platelets: 74 10*3/uL — ABNORMAL LOW (ref 150–400)
RBC: 3.95 MIL/uL — AB (ref 4.22–5.81)
RDW: 18.6 % — ABNORMAL HIGH (ref 11.5–15.5)
WBC: 12.9 10*3/uL — ABNORMAL HIGH (ref 4.0–10.5)

## 2017-03-28 LAB — BASIC METABOLIC PANEL
Anion gap: 8 (ref 5–15)
BUN: 15 mg/dL (ref 6–20)
CHLORIDE: 105 mmol/L (ref 101–111)
CO2: 23 mmol/L (ref 22–32)
Calcium: 8.7 mg/dL — ABNORMAL LOW (ref 8.9–10.3)
Creatinine, Ser: 0.91 mg/dL (ref 0.61–1.24)
GFR calc Af Amer: >60 mL/min (ref >60)
GFR calc non Af Amer: >60 mL/min (ref >60)
GLUCOSE: 95 mg/dL (ref 65–99)
POTASSIUM: 4.5 mmol/L (ref 3.5–5.1)
Sodium: 136 mmol/L (ref 135–145)

## 2017-03-28 LAB — MRSA PCR SCREENING: MRSA BY PCR: NEGATIVE

## 2017-03-28 SURGERY — OPEN REDUCTION INTERNAL FIXATION (ORIF) TIBIAL PLATEAU
Anesthesia: General | Site: Knee | Laterality: Left

## 2017-03-28 MED ORDER — DEXAMETHASONE SODIUM PHOSPHATE 10 MG/ML IJ SOLN
10.0000 mg | Freq: Once | INTRAMUSCULAR | Status: AC
  Administered 2017-03-29: 12:00:00 10 mg via INTRAVENOUS
  Filled 2017-03-28: qty 1

## 2017-03-28 MED ORDER — DEXAMETHASONE SODIUM PHOSPHATE 4 MG/ML IJ SOLN
INTRAMUSCULAR | Status: DC | PRN
  Administered 2017-03-28: 4 mg via INTRAVENOUS

## 2017-03-28 MED ORDER — ASPIRIN 81 MG PO CHEW
81.0000 mg | CHEWABLE_TABLET | Freq: Two times a day (BID) | ORAL | Status: DC
  Administered 2017-03-29 – 2017-03-30 (×3): 81 mg via ORAL
  Filled 2017-03-28 (×3): qty 1

## 2017-03-28 MED ORDER — PROMETHAZINE HCL 25 MG/ML IJ SOLN: 6.2500 mg | INTRAMUSCULAR | Status: DC | PRN

## 2017-03-28 MED ORDER — HYDROMORPHONE HCL 1 MG/ML IJ SOLN
0.2500 mg | INTRAMUSCULAR | Status: DC | PRN
  Administered 2017-03-28 (×3): 0.5 mg via INTRAVENOUS

## 2017-03-28 MED ORDER — SODIUM CHLORIDE 0.9 % IV SOLN
INTRAVENOUS | Status: DC
  Administered 2017-03-28 (×2): via INTRAVENOUS

## 2017-03-28 MED ORDER — CEFAZOLIN SODIUM-DEXTROSE 2-4 GM/100ML-% IV SOLN
INTRAVENOUS | Status: AC
  Filled 2017-03-28: qty 100

## 2017-03-28 MED ORDER — HYDROCHLOROTHIAZIDE 25 MG PO TABS
25.0000 mg | ORAL_TABLET | Freq: Every day | ORAL | Status: DC
  Administered 2017-03-29 – 2017-03-30 (×2): 25 mg via ORAL
  Filled 2017-03-28 (×2): qty 1

## 2017-03-28 MED ORDER — SODIUM CHLORIDE 0.9 % IV SOLN
INTRAVENOUS | Status: AC
  Administered 2017-03-28 (×2): via INTRAVENOUS

## 2017-03-28 MED ORDER — ASPIRIN EC 81 MG PO TBEC: 81.0000 mg | DELAYED_RELEASE_TABLET | Freq: Every day | ORAL | Status: DC

## 2017-03-28 MED ORDER — POVIDONE-IODINE 10 % EX SWAB: 2.0000 "application " | Freq: Once | CUTANEOUS | Status: DC

## 2017-03-28 MED ORDER — MENTHOL 3 MG MT LOZG: 1.0000 | LOZENGE | OROMUCOSAL | Status: DC | PRN

## 2017-03-28 MED ORDER — ONDANSETRON HCL 4 MG/2ML IJ SOLN: 4.0000 mg | Freq: Four times a day (QID) | INTRAMUSCULAR | Status: DC | PRN

## 2017-03-28 MED ORDER — HYDROMORPHONE HCL 1 MG/ML IJ SOLN
0.5000 mg | INTRAMUSCULAR | Status: DC | PRN
  Administered 2017-03-28 (×3): 1 mg via INTRAVENOUS
  Filled 2017-03-28 (×3): qty 1

## 2017-03-28 MED ORDER — CEFAZOLIN SODIUM-DEXTROSE 2-4 GM/100ML-% IV SOLN
2.0000 g | INTRAVENOUS | Status: AC
  Administered 2017-03-28: 2 g via INTRAVENOUS

## 2017-03-28 MED ORDER — PHENYLEPHRINE HCL 10 MG/ML IJ SOLN
INTRAMUSCULAR | Status: DC | PRN
  Administered 2017-03-28 (×2): 80 ug via INTRAVENOUS

## 2017-03-28 MED ORDER — MAGNESIUM CITRATE PO SOLN: 1.0000 | Freq: Once | ORAL | Status: DC | PRN

## 2017-03-28 MED ORDER — MEPERIDINE HCL 50 MG/ML IJ SOLN
6.2500 mg | INTRAMUSCULAR | Status: DC | PRN
Start: 2017-03-28 — End: 2017-03-28

## 2017-03-28 MED ORDER — MIDAZOLAM HCL 2 MG/2ML IJ SOLN
INTRAMUSCULAR | Status: DC | PRN
  Administered 2017-03-28: 2 mg via INTRAVENOUS

## 2017-03-28 MED ORDER — ALUM & MAG HYDROXIDE-SIMETH 200-200-20 MG/5ML PO SUSP: 15.0000 mL | ORAL | Status: DC | PRN

## 2017-03-28 MED ORDER — VERAPAMIL HCL 80 MG PO TABS
80.0000 mg | ORAL_TABLET | Freq: Three times a day (TID) | ORAL | Status: DC
  Administered 2017-03-29 – 2017-03-30 (×5): 80 mg via ORAL
  Filled 2017-03-28 (×5): qty 1

## 2017-03-28 MED ORDER — ONDANSETRON HCL 4 MG/2ML IJ SOLN
INTRAMUSCULAR | Status: DC | PRN
  Administered 2017-03-28: 4 mg via INTRAVENOUS

## 2017-03-28 MED ORDER — PROPOFOL 10 MG/ML IV BOLUS
INTRAVENOUS | Status: AC
Start: 2017-03-28 — End: 2017-03-28
  Filled 2017-03-28: qty 20

## 2017-03-28 MED ORDER — METOCLOPRAMIDE HCL 5 MG PO TABS: 5.0000 mg | ORAL_TABLET | Freq: Three times a day (TID) | ORAL | Status: DC | PRN

## 2017-03-28 MED ORDER — BISACODYL 10 MG RE SUPP: 10.0000 mg | Freq: Every day | RECTAL | Status: DC | PRN

## 2017-03-28 MED ORDER — LOSARTAN POTASSIUM 25 MG PO TABS: 25.0000 mg | ORAL_TABLET | Freq: Every day | ORAL | Status: DC

## 2017-03-28 MED ORDER — HYDROCODONE-ACETAMINOPHEN 5-325 MG PO TABS: 1.0000 | ORAL_TABLET | ORAL | Status: DC | PRN

## 2017-03-28 MED ORDER — CEFAZOLIN SODIUM-DEXTROSE 2-4 GM/100ML-% IV SOLN
2.0000 g | Freq: Four times a day (QID) | INTRAVENOUS | Status: AC
  Administered 2017-03-28 – 2017-03-29 (×2): 2 g via INTRAVENOUS
  Filled 2017-03-28 (×2): qty 100

## 2017-03-28 MED ORDER — DEXAMETHASONE SODIUM PHOSPHATE 10 MG/ML IJ SOLN
INTRAMUSCULAR | Status: AC
  Filled 2017-03-28: qty 1

## 2017-03-28 MED ORDER — POLYETHYLENE GLYCOL 3350 17 G PO PACK
17.0000 g | PACK | Freq: Two times a day (BID) | ORAL | Status: DC
  Administered 2017-03-28 – 2017-03-30 (×3): 17 g via ORAL
  Filled 2017-03-28 (×3): qty 1

## 2017-03-28 MED ORDER — HYDROMORPHONE HCL 1 MG/ML IJ SOLN: 1.0000 mg | INTRAMUSCULAR | Status: DC | PRN

## 2017-03-28 MED ORDER — LEVETIRACETAM 500 MG PO TABS
1000.0000 mg | ORAL_TABLET | Freq: Two times a day (BID) | ORAL | Status: DC
  Administered 2017-03-29 – 2017-03-30 (×3): 1000 mg via ORAL
  Filled 2017-03-28 (×3): qty 2

## 2017-03-28 MED ORDER — 0.9 % SODIUM CHLORIDE (POUR BTL) OPTIME
TOPICAL | Status: DC | PRN
  Administered 2017-03-28: 1000 mL

## 2017-03-28 MED ORDER — FERROUS SULFATE 325 (65 FE) MG PO TABS
325.0000 mg | ORAL_TABLET | Freq: Three times a day (TID) | ORAL | Status: DC
  Administered 2017-03-29 – 2017-03-30 (×5): 325 mg via ORAL
  Filled 2017-03-28 (×5): qty 1

## 2017-03-28 MED ORDER — FENTANYL CITRATE (PF) 250 MCG/5ML IJ SOLN
INTRAMUSCULAR | Status: AC
  Filled 2017-03-28: qty 5

## 2017-03-28 MED ORDER — HYDROMORPHONE HCL 1 MG/ML IJ SOLN
INTRAMUSCULAR | Status: AC
  Filled 2017-03-28: qty 0.5

## 2017-03-28 MED ORDER — ADULT MULTIVITAMIN W/MINERALS CH: 1.0000 | ORAL_TABLET | Freq: Every day | ORAL | Status: DC

## 2017-03-28 MED ORDER — PRAVASTATIN SODIUM 20 MG PO TABS
20.0000 mg | ORAL_TABLET | Freq: Every day | ORAL | Status: DC
  Administered 2017-03-29 – 2017-03-30 (×2): 20 mg via ORAL
  Filled 2017-03-28 (×2): qty 1

## 2017-03-28 MED ORDER — HYDROCHLOROTHIAZIDE 50 MG PO TABS: 100.0000 mg | ORAL_TABLET | Freq: Every day | ORAL | Status: DC

## 2017-03-28 MED ORDER — CHLORHEXIDINE GLUCONATE 4 % EX LIQD: 60.0000 mL | Freq: Once | CUTANEOUS | Status: DC

## 2017-03-28 MED ORDER — ONDANSETRON HCL 4 MG PO TABS: 4.0000 mg | ORAL_TABLET | Freq: Four times a day (QID) | ORAL | Status: DC | PRN

## 2017-03-28 MED ORDER — LIDOCAINE 2% (20 MG/ML) 5 ML SYRINGE
INTRAMUSCULAR | Status: AC
  Filled 2017-03-28: qty 5

## 2017-03-28 MED ORDER — SODIUM CHLORIDE 0.9 % IV SOLN
INTRAVENOUS | Status: DC
  Administered 2017-03-28: 20:00:00 via INTRAVENOUS

## 2017-03-28 MED ORDER — OXYCODONE HCL 5 MG PO TABS
5.0000 mg | ORAL_TABLET | ORAL | Status: DC
Start: 2017-03-28 — End: 2017-03-29
  Administered 2017-03-28 – 2017-03-29 (×3): 10 mg via ORAL
  Filled 2017-03-28 (×3): qty 2

## 2017-03-28 MED ORDER — FENTANYL CITRATE (PF) 100 MCG/2ML IJ SOLN
INTRAMUSCULAR | Status: DC | PRN
  Administered 2017-03-28 (×4): 50 ug via INTRAVENOUS

## 2017-03-28 MED ORDER — MIDAZOLAM HCL 2 MG/2ML IJ SOLN
INTRAMUSCULAR | Status: AC
  Filled 2017-03-28: qty 2

## 2017-03-28 MED ORDER — PROPOFOL 10 MG/ML IV BOLUS
INTRAVENOUS | Status: AC
  Filled 2017-03-28: qty 20

## 2017-03-28 MED ORDER — DEXTROSE 5 % IV SOLN
500.0000 mg | Freq: Four times a day (QID) | INTRAVENOUS | Status: DC | PRN
  Administered 2017-03-28: 500 mg via INTRAVENOUS
  Filled 2017-03-28: qty 550

## 2017-03-28 MED ORDER — LACTATED RINGERS IV SOLN: INTRAVENOUS | Status: DC

## 2017-03-28 MED ORDER — LIDOCAINE HCL (CARDIAC) 20 MG/ML IV SOLN
INTRAVENOUS | Status: DC | PRN
  Administered 2017-03-28: 100 mg via INTRAVENOUS

## 2017-03-28 MED ORDER — DIPHENHYDRAMINE HCL 25 MG PO CAPS
25.0000 mg | ORAL_CAPSULE | Freq: Four times a day (QID) | ORAL | Status: DC | PRN
  Administered 2017-03-28 – 2017-03-29 (×2): 25 mg via ORAL
  Filled 2017-03-28 (×2): qty 1

## 2017-03-28 MED ORDER — METHOCARBAMOL 500 MG PO TABS: 500.0000 mg | ORAL_TABLET | Freq: Four times a day (QID) | ORAL | Status: DC | PRN

## 2017-03-28 MED ORDER — DOCUSATE SODIUM 100 MG PO CAPS
100.0000 mg | ORAL_CAPSULE | Freq: Two times a day (BID) | ORAL | Status: DC
  Administered 2017-03-28 – 2017-03-30 (×3): 100 mg via ORAL
  Filled 2017-03-28 (×3): qty 1

## 2017-03-28 MED ORDER — CELECOXIB 200 MG PO CAPS
200.0000 mg | ORAL_CAPSULE | Freq: Two times a day (BID) | ORAL | Status: DC
  Administered 2017-03-28 – 2017-03-30 (×4): 200 mg via ORAL
  Filled 2017-03-28 (×4): qty 1

## 2017-03-28 MED ORDER — METOCLOPRAMIDE HCL 5 MG/ML IJ SOLN
5.0000 mg | Freq: Three times a day (TID) | INTRAMUSCULAR | Status: DC | PRN
Start: 2017-03-28 — End: 2017-03-30

## 2017-03-28 MED ORDER — LOSARTAN POTASSIUM 50 MG PO TABS
100.0000 mg | ORAL_TABLET | Freq: Every day | ORAL | Status: DC
  Administered 2017-03-29 – 2017-03-30 (×2): 100 mg via ORAL
  Filled 2017-03-28 (×2): qty 2

## 2017-03-28 MED ORDER — HYDROMORPHONE HCL 1 MG/ML IJ SOLN: 0.5000 mg | INTRAMUSCULAR | Status: DC | PRN

## 2017-03-28 MED ORDER — PROPOFOL 10 MG/ML IV BOLUS
INTRAVENOUS | Status: DC | PRN
  Administered 2017-03-28: 200 mg via INTRAVENOUS

## 2017-03-28 MED ORDER — ONDANSETRON HCL 4 MG/2ML IJ SOLN
INTRAMUSCULAR | Status: AC
  Filled 2017-03-28: qty 2

## 2017-03-28 MED ORDER — PHENOL 1.4 % MT LIQD
1.0000 | OROMUCOSAL | Status: DC | PRN
  Filled 2017-03-28: qty 177

## 2017-03-28 MED ORDER — LACTATED RINGERS IV SOLN
INTRAVENOUS | Status: DC | PRN
  Administered 2017-03-28: 17:00:00 via INTRAVENOUS

## 2017-03-28 SURGICAL SUPPLY — 78 items
BAG SPEC THK2 15X12 ZIP CLS (MISCELLANEOUS) ×1
BAG ZIPLOCK 12X15 (MISCELLANEOUS) ×3 IMPLANT
BANDAGE ACE 6X5 VEL STRL LF (GAUZE/BANDAGES/DRESSINGS) ×2 IMPLANT
BIT DRILL 100X2.5XANTM LCK (BIT) IMPLANT
BIT DRILL CAL (BIT) IMPLANT
BIT DRILL SLEEVE MEASURING (BIT) IMPLANT
BIT DRL 100X2.5XANTM LCK (BIT) ×1
BLADE SAW SGTL 11.0X1.19X90.0M (BLADE) IMPLANT
BNDG COHESIVE 4X5 TAN STRL (GAUZE/BANDAGES/DRESSINGS) ×3 IMPLANT
BNDG COHESIVE 6X5 TAN STRL LF (GAUZE/BANDAGES/DRESSINGS) ×3 IMPLANT
CLOSURE WOUND 1/2 X4 (GAUZE/BANDAGES/DRESSINGS)
COVER SURGICAL LIGHT HANDLE (MISCELLANEOUS) ×3 IMPLANT
CUFF TOURN SGL QUICK 34 (TOURNIQUET CUFF) ×3
CUFF TRNQT CYL 34X4X40X1 (TOURNIQUET CUFF) ×1 IMPLANT
DRAPE C-ARM 42X120 X-RAY (DRAPES) ×3 IMPLANT
DRAPE INCISE IOBAN 66X45 STRL (DRAPES) ×3 IMPLANT
DRAPE SHEET LG 3/4 BI-LAMINATE (DRAPES) ×1 IMPLANT
DRAPE U-SHAPE 47X51 STRL (DRAPES) ×3 IMPLANT
DRILL BIT 2.5MM (BIT) ×3
DRILL BIT CAL (BIT) ×3
DRILL SLEEVE MEASURING (BIT) ×3
DRSG EMULSION OIL 3X16 NADH (GAUZE/BANDAGES/DRESSINGS) ×3 IMPLANT
DRSG PAD ABDOMINAL 8X10 ST (GAUZE/BANDAGES/DRESSINGS) ×2 IMPLANT
DURAPREP 26ML APPLICATOR (WOUND CARE) ×3 IMPLANT
ELECT REM PT RETURN 15FT ADLT (MISCELLANEOUS) ×3 IMPLANT
GAUZE SPONGE 4X4 12PLY STRL (GAUZE/BANDAGES/DRESSINGS) ×4 IMPLANT
GAUZE XEROFORM 5X9 LF (GAUZE/BANDAGES/DRESSINGS) ×2 IMPLANT
GLOVE BIOGEL M 7.0 STRL (GLOVE) IMPLANT
GLOVE BIOGEL PI IND STRL 7.5 (GLOVE) ×1 IMPLANT
GLOVE BIOGEL PI IND STRL 8.5 (GLOVE) ×1 IMPLANT
GLOVE BIOGEL PI INDICATOR 7.5 (GLOVE) ×2
GLOVE BIOGEL PI INDICATOR 8.5 (GLOVE) ×2
GLOVE ECLIPSE 8.0 STRL XLNG CF (GLOVE) ×2 IMPLANT
GLOVE ORTHO TXT STRL SZ7.5 (GLOVE) ×6 IMPLANT
GLOVE SURG ORTHO 8.0 STRL STRW (GLOVE) ×1 IMPLANT
GOWN STRL REUS W/TWL LRG LVL3 (GOWN DISPOSABLE) ×3 IMPLANT
GOWN STRL REUS W/TWL XL LVL3 (GOWN DISPOSABLE) ×4 IMPLANT
GRAFT DBM PARTICULATE CANC 15 (Bone Implant) IMPLANT
GRAFT IC CHAMBER LG (Bone Implant) ×3 IMPLANT
IMMOBILIZER KNEE 20 (SOFTGOODS)
IMMOBILIZER KNEE 20 THIGH 36 (SOFTGOODS) ×1 IMPLANT
K-WIRE ACE 1.6X6 (WIRE) ×9
KIT BASIN OR (CUSTOM PROCEDURE TRAY) ×3 IMPLANT
KWIRE ACE 1.6X6 (WIRE) IMPLANT
MANIFOLD NEPTUNE II (INSTRUMENTS) ×3 IMPLANT
NS IRRIG 1000ML POUR BTL (IV SOLUTION) ×3 IMPLANT
PACK ORTHO EXTREMITY (CUSTOM PROCEDURE TRAY) ×1 IMPLANT
PAD CAST 4YDX4 CTTN HI CHSV (CAST SUPPLIES) ×2 IMPLANT
PADDING CAST COTTON 4X4 STRL (CAST SUPPLIES) ×6
PLATE LOCK 5H STD LT PROX TIB (Plate) ×2 IMPLANT
POSITIONER SURGICAL ARM (MISCELLANEOUS) ×3 IMPLANT
SCREW CORTICAL 3.5MM  34MM (Screw) ×2 IMPLANT
SCREW CORTICAL 3.5MM 34MM (Screw) IMPLANT
SCREW CORTICAL 3.5MM 36MM (Screw) ×2 IMPLANT
SCREW CORTICAL 3.5MM 38MM (Screw) ×2 IMPLANT
SCREW LOCK CORT STAR 3.5X65 (Screw) ×4 IMPLANT
SCREW LOCK CORT STAR 3.5X70 (Screw) ×4 IMPLANT
SCREW LP 3.5X85MM (Screw) ×2 IMPLANT
SCREW LP 3.5X90MM (Screw) ×2 IMPLANT
SET PAD KNEE POSITIONER (MISCELLANEOUS) ×1 IMPLANT
SPONGE LAP 18X18 X RAY DECT (DISPOSABLE) ×2 IMPLANT
STAPLER VISISTAT (STAPLE) ×2 IMPLANT
STOCKINETTE 8 INCH (MISCELLANEOUS) ×1 IMPLANT
STRIP CLOSURE SKIN 1/2X4 (GAUZE/BANDAGES/DRESSINGS) ×1 IMPLANT
SUCTION FRAZIER HANDLE 10FR (MISCELLANEOUS)
SUCTION TUBE FRAZIER 10FR DISP (MISCELLANEOUS) ×1 IMPLANT
SUT ETHILON 2 0 PS N (SUTURE) ×2 IMPLANT
SUT ETHILON 4 0 PS 2 18 (SUTURE) ×1 IMPLANT
SUT MNCRL AB 4-0 PS2 18 (SUTURE) ×1 IMPLANT
SUT VIC AB 1 CT1 27 (SUTURE)
SUT VIC AB 1 CT1 27XBRD ANTBC (SUTURE) ×5 IMPLANT
SUT VIC AB 1 CT1 36 (SUTURE) ×4 IMPLANT
SUT VIC AB 2-0 CT1 27 (SUTURE) ×6
SUT VIC AB 2-0 CT1 TAPERPNT 27 (SUTURE) IMPLANT
SUT VIC AB 2-0 CT2 27 (SUTURE) ×1 IMPLANT
TOWEL OR 17X26 10 PK STRL BLUE (TOWEL DISPOSABLE) ×2 IMPLANT
WATER STERILE IRR 1500ML POUR (IV SOLUTION) ×1 IMPLANT
YANKAUER SUCT BULB TIP 10FT TU (MISCELLANEOUS) ×2 IMPLANT

## 2017-03-28 NOTE — Op Note (Signed)
NAMEHILLERY, Eugene [name] NO.:  [id number]  MEDICAL RECORD NO.:  [id number]  LOCATION:  WA12                         FACILITY:  [facility]  PHYSICIAN:  Madlyn Frankel. Charlann Boxer, M.D.  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  POSTOPERATIVE DIAGNOSIS:  Split, depressed lateral tibial plateau fracture consistent with a Schatzker 2 pattern.  PROCEDURE:  Open reduction and internal fixation of left lateral tibial plateau fracture with allograft bone grafting.  A Biomet five-hole tibial plateau plate, standard.  SURGEON:  Madlyn Frankel. Charlann Boxer, M.D.  ASSISTANT:  Lanney Gins, PA-C.  Note that Eugene [name] was present for the entirety of the case from preoperative position, perioperative management of the operative extremity, general facilitation of the case and primary wound closure.  ANESTHESIA:  General, LMA.  SPECIMENS:  None.  COMPLICATIONS:  None.  BLOOD LOSS:  Minimal.  TOURNIQUET TIME:  56 minutes at 250 mmHg.  INDICATIONS FOR PROCEDURE:  Eugene [name] is a pleasant 56 year old male, unfortunately with history of hemorrhagic stroke 6 years ago.  He is hemiparetic on his right side and thus very dependent on his left lower extremity.  Unfortunately had a stumble and fall and landed on his left leg day prior to his admission through the ER.  Due to increasing pain and swelling, he eventually was brought to the emergency room where radiographs revealed at least a split tibial plateau fracture.  He was admitted to the hospital overnight where CT scan revealed a split depressed fracture.  Indications of the procedure were reviewed with the patient and his sister.  His postoperative course will be somewhat complicated related to his inability to bear weight on this left lower  extremity, but at the same time, the pain into the left lower extremity for ambulation.  Risks of nonunion, need for future surgeries and potential accelerated degenerative changes in this already arthritic left knee were reviewed and consent was obtained for benefit of fracture management.  PROCEDURE IN DETAIL:  The patient was brought to the operative theater. Once adequate anesthesia, preoperative antibiotics, Ancef administered, he was positioned supine.  A left thigh tourniquet was placed.  The left lower extremity was then prepped and draped in sterile fashion.  The time-out was performed identifying the patient, planned procedure and extremity.  Landmarks were identified on the skin and a hockey L-shaped incision was made over the anterolateral aspect of his leg.  Soft tissue dissection was carried down to the anterior tibial fascia.  Once soft tissue planes were created, I incised the fascia in the same incision line.  Soft tissue dissection was carried down to the tibia, identifying the fracture readily.  The split segment was noted to be a separate piece.  At this point, once I had adequate exposure of the proximal femur, I then manually retracted the split segment away and manually elevated the depressed fragment.  I then confirmed this on radiograph and revealed basically a near anatomic reduction of the joint line.  At this point, I  opened up cancellous bone graft and backfilled the deficit and to buttress this elevated segment.  This was confirmed again radiographically.  At this point, we selected a standard flared lateral tibial plateau five-hole plate.  It was then placed over the proximal tibia and fit anatomically.  It was confirmed radiographically and holding in place with this K-wire.  Once I had oriented the way that I wished, I placed two nonlocking bicortical screws proximal to apply compression.  I confirmed this radiographically and then placed three of the  nonlocking bicortical screws distally, holding the plate in place there.  At this point, I placed one kickstand screw as a locking screw and then four other locking screws proximally.  Once I was complete of this, final radiographs were obtained in AP and lateral planes confirming the near anatomic reduction.  We irrigated the wound throughout the case again at this point.  At this point, I reapproximated the fascia of the anterior compartment using #1 Vicryl. The remainder of the wound was closed with 2-0 Vicryl.  In the corner of the suture, I used the 2-0 nylon as a corner stitch pattern, reapproximate the skin edges anatomically.  Once the incision was closed with staples, the skin was cleaned, dried and dressed sterilely using Xeroform and a bulky wrap.  The patient was then brought to the recovery room in stable condition.  Postoperatively, he will be nonweightbearing for 6 weeks.  He will have followup radiographs and wound evaluations.  He will be seen and evaluated by Physical therapy in the hospital and his disposition will be determined based on his abilities to perform with them.  From there, he will be permitted and encouraged to work on range of motion in the knee to prevent stiffness from the procedure.     Madlyn FrankelMatthew D. Charlann Boxerlin, M.D.     MDO/MEDQ  D:  03/28/2017  T:  03/28/2017  Job:  [id number]

## 2017-03-28 NOTE — Transfer of Care (Signed)
Immediate Anesthesia Transfer of Care Note  Patient: Eugene [name]  Procedure(s) Performed: Procedure(s): OPEN REDUCTION INTERNAL FIXATION (ORIF) TIBIAL PLATEAU (Left)  Patient Location: PACU  Anesthesia Type:General  Level of Consciousness: awake, alert  and oriented  Airway & Oxygen Therapy: Patient Spontanous Breathing and Patient connected to face mask oxygen  Post-op Assessment: Report given to RN and Post -op Vital signs reviewed and stable  Post vital signs: Reviewed and stable  Last Vitals:  Vitals:   03/28/17 1344 03/28/17 1445  BP: 126/69 137/77  Pulse: 83 96  Resp: 16   Temp: 36.9 C 36.9 C    Last Pain:  Vitals:   03/28/17 1600  TempSrc:   PainSc: Asleep      Patients Stated Pain Goal: 3 (03/28/17 1420)  Complications: No apparent anesthesia complications

## 2017-03-28 NOTE — Anesthesia Preprocedure Evaluation (Addendum)
Anesthesia Evaluation  Patient identified by MRN, date of birth, ID band Patient awake    Reviewed: Allergy & Precautions, NPO status , Patient's Chart, lab work & pertinent test results  Airway Mallampati: II  TM Distance: >3 FB Neck ROM: Full    Dental  (+) Teeth Intact, Dental Advisory Given   Pulmonary Current Smoker,    breath sounds clear to auscultation       Cardiovascular hypertension, Pt. 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, Residual Symptoms negative psych ROS   GI/Hepatic negative GI ROS, Neg liver ROS,   Endo/Other  negative endocrine ROS  Renal/GU negative Renal ROS  negative genitourinary   Musculoskeletal negative musculoskeletal ROS (+)   Abdominal   Peds negative pediatric ROS (+)  Hematology negative hematology ROS (+)   Anesthesia Other Findings   Reproductive/Obstetrics negative OB ROS                          Lab Results  Component Value Date   WBC 12.9 (H) 03/28/2017   HGB 12.2 (L) 03/28/2017   HCT 33.3 (L) 03/28/2017   MCV 84.3 03/28/2017   PLT 74 (L) 03/28/2017   Lab Results  Component Value Date   CREATININE 0.91 03/28/2017   BUN 15 03/28/2017   NA 136 03/28/2017   K 4.5 03/28/2017   CL 105 03/28/2017   CO2 23 03/28/2017   Lab Results  Component Value Date   INR 1.06 05/04/2013   INR 0.98 11/19/2010   EKG: normal sinus rhythm.  Anesthesia Physical Anesthesia Plan  ASA: II  Anesthesia Plan: General   Post-op Pain Management:    Induction: Intravenous  Airway Management Planned: LMA  Additional Equipment:   Intra-op Plan:   Post-operative Plan: Extubation in OR  Informed Consent: I have reviewed the patients History and Physical, chart, labs and discussed the procedure including the risks, benefits and alternatives for the proposed anesthesia with the  patient or authorized representative who has indicated his/her understanding and acceptance.   Dental advisory given  Plan Discussed with: CRNA  Anesthesia Plan Comments:         Anesthesia Quick Evaluation

## 2017-03-28 NOTE — Brief Op Note (Signed)
03/27/2017 - 03/28/2017  6:28 PM  PATIENT:  Eugene [name]  56 y.o. male  PRE-OPERATIVE DIAGNOSIS:  left tibial plateau fracture, split depressed lateral tibial plateau fracture  POST-OPERATIVE DIAGNOSIS:  left tibial plateau fracture, split depressed lateral tibial plateau fracture  PROCEDURE:  Procedure(s): OPEN REDUCTION INTERNAL FIXATION (ORIF) TIBIAL PLATEAU (Left) with allograft bone grafting  SURGEON:  Surgeon(s) and Role:    Durene Romans* Charrise Lardner, MD - Primary  PHYSICIAN ASSISTANT: Lanney GinsMatthew Babish, PA-C  ANESTHESIA:   general  EBL:  Total I/O In: 1466.7 [I.V.:1466.7] Out: 700 [Urine:675; Blood:25]  BLOOD ADMINISTERED:none  DRAINS: none   LOCAL MEDICATIONS USED:  NONE  SPECIMEN:  No Specimen  DISPOSITION OF SPECIMEN:  N/A  COUNTS:  YES  TOURNIQUET:   Total Tourniquet Time Documented: Thigh (Left) - 56 minutes Total: Thigh (Left) - 56 minutes   DICTATION: .Other Dictation: Dictation Number [id number]  PLAN OF CARE: Admit to inpatient   PATIENT DISPOSITION:  PACU - hemodynamically stable.   Delay start of Pharmacological VTE agent (>24hrs) due to surgical blood loss or risk of bleeding: no

## 2017-03-28 NOTE — H&P (Addendum)
Eugene [name] is an 56 y.o. male.    Chief Complaint:    Left tibial plateau fracture   HPI:    Eugene [name] is a 56 y.o. male history of hypertension, previous stroke with residual right-sided weakness, who presented to the ER after a fall, sustaining left knee pain. Patient states that he has right-sided weakness at baseline and wears a brace at baseline and does not bear much weight on the right side. Patient states that he was walking at home yesterday and tripped and hit the left knee on a table. He states that he is trying to walk on it today and has progressive left knee pain and swelling. Denies syncope or head injury or other injuries. Dr. Alvan Dame was called by the ED physician to evaluate the patient.  X-rays reveal a left tibial plateau fracture.  A CT scan of the left knee is ordered to evaluate the fracture further.  Risks, benefits and expectations were discussed with the patient. Dr. Alvan Dame discussed with the patient that he would need to have this fixed surgically.  Risks including but not limited to the risk of anesthesia, blood clots, nerve damage, blood vessel damage, failure of the prosthesis, infection and up to and including death.  Patient understand the risks, benefits and expectations and wishes to proceed with surgery. Plan for an ORIF of the left tibial plateau fracture today, 03/28/2017.    PMH: Past Medical History:  Diagnosis Date  . Hypertension   . Seizures ([place])   . Stroke [facility])     PSH: Past Surgical History:  Procedure Laterality Date  . HERNIA REPAIR      Social History:  reports that he has been smoking Cigarettes.  He has a 10.00 pack-year smoking history. He has never used smokeless tobacco. He reports that he drinks about 0.6 oz of alcohol per week . He reports that he does not use drugs.  Allergies:  No Known Allergies  Medications: Current Facility-Administered Medications  Medication Dose Route Frequency Provider Last Rate Last Dose  .  [COMPLETED] 0.9 %  sodium chloride infusion   Intravenous STAT Drenda Freeze, MD 100 mL/hr at 03/28/17 0857    . aspirin EC tablet 81 mg  81 mg Oral Daily Paralee Cancel, MD      . hydrochlorothiazide (HYDRODIURIL) tablet 25 mg  25 mg Oral Daily Paralee Cancel, MD      . HYDROcodone-acetaminophen (NORCO/VICODIN) 5-325 MG per tablet 1-2 tablet  1-2 tablet Oral Q4H PRN Paralee Cancel, MD      . HYDROmorphone (DILAUDID) injection 0.5-2 mg  0.5-2 mg Intravenous Q2H PRN Paralee Cancel, MD   1 mg at 03/28/17 2197  . [START ON 03/29/2017] levETIRAcetam (KEPPRA) tablet 1,000 mg  1,000 mg Oral BID Paralee Cancel, MD      . losartan (COZAAR) tablet 100 mg  100 mg Oral Daily Paralee Cancel, MD      . Derrill Memo ON 03/29/2017] multivitamin with minerals tablet 1 tablet  1 tablet Oral Daily Paralee Cancel, MD      . Derrill Memo ON 03/29/2017] pravastatin (PRAVACHOL) tablet 20 mg  20 mg Oral Daily Paralee Cancel, MD      . Derrill Memo ON 03/29/2017] verapamil (CALAN) tablet 80 mg  80 mg Oral TID Paralee Cancel, MD        Results for orders placed or performed during the hospital encounter of 03/27/17 (from the past 48 hour(s))  CBC with Differential/Platelet     Status: Abnormal   Collection  Time: 03/27/17  8:07 PM  Result Value Ref Range   WBC 16.4 (H) 4.0 - 10.5 K/uL   RBC 4.24 4.22 - 5.81 MIL/uL   Hemoglobin 12.9 (L) 13.0 - 17.0 g/dL   HCT 35.0 (L) 39.0 - 52.0 %   MCV 82.5 78.0 - 100.0 fL   MCH 30.4 26.0 - 34.0 pg   MCHC 36.9 (H) 30.0 - 36.0 g/dL    Comment: PRE-WARMING TECHNIQUE USED   RDW 18.5 (H) 11.5 - 15.5 %   Platelets 130 (L) 150 - 400 K/uL   Neutrophils Relative % 78 %   Neutro Abs 12.8 (H) 1.7 - 7.7 K/uL   Lymphocytes Relative 12 %   Lymphs Abs 2.0 0.7 - 4.0 K/uL   Monocytes Relative 9 %   Monocytes Absolute 1.5 (H) 0.1 - 1.0 K/uL   Eosinophils Relative 0 %   Eosinophils Absolute 0.0 0.0 - 0.7 K/uL   Basophils Relative 0 %   Basophils Absolute 0.0 0.0 - 0.1 K/uL  Comprehensive metabolic panel     Status:  Abnormal   Collection Time: 03/27/17  8:07 PM  Result Value Ref Range   Sodium 138 135 - 145 mmol/L   Potassium 2.9 (L) 3.5 - 5.1 mmol/L   Chloride 103 101 - 111 mmol/L   CO2 26 22 - 32 mmol/L   Glucose, Bld 105 (H) 65 - 99 mg/dL   BUN 14 6 - 20 mg/dL   Creatinine, Ser 0.94 0.61 - 1.24 mg/dL   Calcium 9.4 8.9 - 10.3 mg/dL   Total Protein 7.4 6.5 - 8.1 g/dL   Albumin 4.2 3.5 - 5.0 g/dL   AST 37 15 - 41 U/L   ALT 17 17 - 63 U/L   Alkaline Phosphatase 58 38 - 126 U/L   Total Bilirubin 1.6 (H) 0.3 - 1.2 mg/dL   GFR calc non Af Amer >60 >60 mL/min   GFR calc Af Amer >60 >60 mL/min    Comment: (NOTE) The eGFR has been calculated using the CKD EPI equation. This calculation has not been validated in all clinical situations. eGFR's persistently <60 mL/min signify possible Chronic Kidney Disease.    Anion gap 9 5 - 15  MRSA PCR Screening     Status: None   Collection Time: 03/28/17 12:33 AM  Result Value Ref Range   MRSA by PCR NEGATIVE NEGATIVE    Comment:        The GeneXpert MRSA Assay (FDA approved for NASAL specimens only), is one component of a comprehensive MRSA colonization surveillance program. It is not intended to diagnose MRSA infection nor to guide or monitor treatment for MRSA infections.   CBC     Status: Abnormal   Collection Time: 03/28/17  4:31 AM  Result Value Ref Range   WBC 12.9 (H) 4.0 - 10.5 K/uL   RBC 3.95 (L) 4.22 - 5.81 MIL/uL   Hemoglobin 12.2 (L) 13.0 - 17.0 g/dL   HCT 33.3 (L) 39.0 - 52.0 %   MCV 84.3 78.0 - 100.0 fL   MCH 30.9 26.0 - 34.0 pg   MCHC 36.6 (H) 30.0 - 36.0 g/dL   RDW 18.6 (H) 11.5 - 15.5 %   Platelets 74 (L) 150 - 400 K/uL    Comment: REPEATED TO VERIFY SPECIMEN CHECKED FOR CLOTS DELTA CHECK NOTED PLATELET COUNT CONFIRMED BY SMEAR   Basic metabolic panel     Status: Abnormal   Collection Time: 03/28/17  4:31 AM  Result Value Ref Range  Sodium 136 135 - 145 mmol/L   Potassium 4.5 3.5 - 5.1 mmol/L    Comment: DELTA CHECK  NOTED SLIGHT HEMOLYSIS    Chloride 105 101 - 111 mmol/L   CO2 23 22 - 32 mmol/L   Glucose, Bld 95 65 - 99 mg/dL   BUN 15 6 - 20 mg/dL   Creatinine, Ser 0.91 0.61 - 1.24 mg/dL   Calcium 8.7 (L) 8.9 - 10.3 mg/dL   GFR calc non Af Amer >60 >60 mL/min   GFR calc Af Amer >60 >60 mL/min    Comment: (NOTE) The eGFR has been calculated using the CKD EPI equation. This calculation has not been validated in all clinical situations. eGFR's persistently <60 mL/min signify possible Chronic Kidney Disease.    Anion gap 8 5 - 15   Dg Knee Complete 4 Views Left  Result Date: 03/27/2017 CLINICAL DATA:  RIGHT knee pain and swelling for 1 day after slipping on a wet floor at home last night EXAM: LEFT KNEE - COMPLETE 4+ VIEW COMPARISON:  None FINDINGS: Osseous demineralization. Scattered joint space narrowing especially lateral compartment. Mildly displaced oblique intra-articular fracture of the lateral tibial plateau. Associated joint effusion and soft tissue swelling. Nondisplaced fracture at fibular neck. No additional fracture, dislocation, or bone destruction. IMPRESSION: Fractures of the lateral LEFT tibial plateau and the LEFT fibular neck as above with associated knee joint effusion and soft tissue swelling. Underlying osseous demineralization and degenerative changes LEFT knee. Electronically Signed   By: Lavonia Dana M.D.   On: 03/27/2017 18:19     Review of Systems  HENT: Negative.   Eyes: Negative.   Respiratory: Negative.   Cardiovascular: Negative.   Gastrointestinal: Negative.   Genitourinary: Negative.   Musculoskeletal: Positive for joint pain.  Skin: Negative.   Neurological: Positive for weakness (generalized right sided weakness).  Endo/Heme/Allergies: Negative.   Psychiatric/Behavioral: Negative.        Physical Exam  Constitutional: He appears well-developed.  HENT:  Head: Normocephalic.  Eyes: Pupils are equal, round, and reactive to light.  Neck: Neck supple. No  JVD present. No tracheal deviation present. No thyromegaly present.  Cardiovascular: Normal rate, regular rhythm and intact distal pulses.   Respiratory: Effort normal and breath sounds normal. No respiratory distress. He has no wheezes.  GI: Soft. There is no tenderness. There is no guarding.  Musculoskeletal:       Left knee: He exhibits decreased range of motion, swelling and bony tenderness. He exhibits no ecchymosis, no deformity, no laceration and no erythema. Tenderness found.  Lymphadenopathy:    He has no cervical adenopathy.  Neurological: He is alert.  Skin: Skin is warm and dry.       Assessment/Plan Assessment:   Left tibial plateau fracture  Plan: Patient will undergo an ORIF of the left tibial plateau fracture on 03/28/2017 per Dr. Alvan Dame at [facility]. Risks benefits and expectations were discussed with the patient. Patient understand risks, benefits and expectations and wishes to proceed.   [place] Pugh Babish   PA-C  03/28/2017, 9:40 AM     Plan as outlined reviewed with patient Post-operative course will be challenging related to his right lower extremity effected by stroke 6 years ago Disposition pending post-op therapy eval

## 2017-03-28 NOTE — Anesthesia Postprocedure Evaluation (Addendum)
Anesthesia Post Note  Patient: Eugene [name]  Procedure(s) Performed: Procedure(s) (LRB): OPEN REDUCTION INTERNAL FIXATION (ORIF) TIBIAL PLATEAU (Left)  Patient location during evaluation: PACU Anesthesia Type: General Level of consciousness: awake and alert Pain management: pain level controlled Vital Signs Assessment: post-procedure vital signs reviewed and stable Respiratory status: spontaneous breathing, nonlabored ventilation, respiratory function stable and patient connected to nasal cannula oxygen Cardiovascular status: blood pressure returned to baseline and stable Postop Assessment: no signs of nausea or vomiting Anesthetic complications: no       Last Vitals:  Vitals:   03/28/17 1930 03/28/17 1940  BP: (!) 154/88 (!) 159/91  Pulse: 98 99  Resp: 10 10  Temp:  36.7 C    Last Pain:  Vitals:   03/28/17 1940  TempSrc:   PainSc: 3     LLE Motor Response: Purposeful movement;Responds to commands (03/28/17 1940) LLE Sensation: Full sensation (03/28/17 1940) RLE Motor Response: Purposeful movement;Responds to commands (03/28/17 1940) RLE Sensation: Full sensation (03/28/17 1940)      Shelton SilvasKevin D Tylan Briguglio

## 2017-03-28 NOTE — Progress Notes (Signed)
Initial Nutrition Assessment  DOCUMENTATION CODES:   Not applicable  INTERVENTION:  - Diet advancement as medically feasible. - RD will continue to monitor for additional needs, including needs for education.  NUTRITION DIAGNOSIS:   Inadequate oral intake related to inability to eat as evidenced by NPO status.  GOAL:   Patient will meet greater than or equal to 90% of their needs  MONITOR:   Diet advancement, Weight trends, Labs  REASON FOR ASSESSMENT:   Malnutrition Screening Tool  ASSESSMENT:   56 y.o. male history of hypertension, previous stroke with residual right-sided weakness, who presented to the ER after a fall, sustaining left knee pain. Patient states that he has right-sided weakness at baseline and wears a brace at baseline and does not bear much weight on the right side. Patient states that he was walking at home yesterday and tripped and hit the left knee on a table. X-rays reveal a left tibial plateau fracture. A CT scan of the left knee is ordered to evaluate the fracture further.  Pt seen for MST. BMI indicates overweight status. Pt has been NPO since admission. Notes indicate pt with R-sided weakness d/t previous stroke; unsure if this affects self-feeding ability to ability to prepare foods. Notes also indicate pt reports that he does not eat much meat; will monitor for potential need to educate on adequate protein intake from plant and/or dairy sources. Pt is currently out of the room to CT and Ortho note states plan for ORIF today.   Unable to complete physical assessment at this time but will do so at follow-up. Per chart review, current weight is exactly the same as weight on 05/16/16 (81.6 kg) which is unlikely. Will monitor weight trends during admission. Notes indicate pt had reported that his appetite fluctuates.  Medications reviewed; 25 mg Hydrodiuril/day, daily multivitamin with minerals, 10 mEq IV KCl x1 run yesterday.  Labs reviewed; Ca: 8.7  mg/dL.  IVF: NS @ 100 mL/hr.   Diet Order:  Diet NPO time specified  Skin:  Reviewed, no issues  Last BM:  5/21  Height:   Ht Readings from Last 1 Encounters:  03/27/17 5\' 7"  (1.702 m)    Weight:   Wt Readings from Last 1 Encounters:  03/27/17 180 lb (81.6 kg)    Ideal Body Weight:  67.27 kg  BMI:  Body mass index is 28.19 kg/m.  Estimated Nutritional Needs:   Kcal:  [id number]  Protein:  82-98 grams (1-1.2 grams/kg)  Fluid:  >/= 1.7 L/day  EDUCATION NEEDS:   No education needs identified at this time    Trenton GammonJessica Kaeley Vinje, MS, RD, LDN, CNSC Inpatient Clinical Dietitian Pager # [phone] After hours/weekend pager # [phone]

## 2017-03-28 NOTE — Anesthesia Procedure Notes (Signed)
Procedure Name: LMA Insertion Date/Time: 03/28/2017 5:04 PM Performed by: Vanessa DurhamOCHRAN, Fabrizzio Marcella GLENN Pre-anesthesia Checklist: Emergency Drugs available, Patient identified, Suction available and Patient being monitored Patient Re-evaluated:Patient Re-evaluated prior to inductionOxygen Delivery Method: Circle system utilized Preoxygenation: Pre-oxygenation with 100% oxygen Intubation Type: IV induction Ventilation: Mask ventilation without difficulty LMA: LMA inserted LMA Size: 4.0 Number of attempts: 1 Placement Confirmation: positive ETCO2 and breath sounds checked- equal and bilateral Tube secured with: Tape Dental Injury: Teeth and Oropharynx as per pre-operative assessment

## 2017-03-29 ENCOUNTER — Encounter (HOSPITAL_COMMUNITY): Payer: Self-pay | Admitting: Orthopedic Surgery

## 2017-03-29 DIAGNOSIS — E663 Overweight: Secondary | ICD-10-CM | POA: Diagnosis present

## 2017-03-29 LAB — BASIC METABOLIC PANEL
Anion gap: 6 (ref 5–15)
BUN: 13 mg/dL (ref 6–20)
CALCIUM: 8.3 mg/dL — AB (ref 8.9–10.3)
CO2: 27 mmol/L (ref 22–32)
CREATININE: 1.03 mg/dL (ref 0.61–1.24)
Chloride: 106 mmol/L (ref 101–111)
GFR calc Af Amer: >60 mL/min (ref >60)
GLUCOSE: 147 mg/dL — AB (ref 65–99)
POTASSIUM: 3.5 mmol/L (ref 3.5–5.1)
Sodium: 139 mmol/L (ref 135–145)

## 2017-03-29 LAB — CBC
HCT: 30 % — ABNORMAL LOW (ref 39.0–52.0)
Hemoglobin: 10.8 g/dL — ABNORMAL LOW (ref 13.0–17.0)
MCH: 30.2 pg (ref 26.0–34.0)
MCHC: 36 g/dL (ref 30.0–36.0)
MCV: 83.8 fL (ref 78.0–100.0)
PLATELETS: 109 10*3/uL — AB (ref 150–400)
RBC: 3.58 MIL/uL — AB (ref 4.22–5.81)
RDW: 18.9 % — AB (ref 11.5–15.5)
WBC: 13.2 10*3/uL — ABNORMAL HIGH (ref 4.0–10.5)

## 2017-03-29 MED ORDER — HYDROCODONE-ACETAMINOPHEN 5-325 MG PO TABS
1.0000 | ORAL_TABLET | ORAL | Status: DC | PRN
  Administered 2017-03-29 (×2): 1 via ORAL
  Administered 2017-03-29 (×2): 2 via ORAL
  Administered 2017-03-30 (×2): 1 via ORAL
  Filled 2017-03-29: qty 2
  Filled 2017-03-29 (×2): qty 1
  Filled 2017-03-29 (×2): qty 2
  Filled 2017-03-29: qty 1

## 2017-03-29 NOTE — Clinical Social Work Note (Signed)
Clinical Social Work Assessment  Patient Details  Name: Eugene [name] MRN: [id number] Date of Birth: 1961-04-30  Date of referral:  03/29/17               Reason for consult:  Discharge Planning                Permission sought to share information with:  Facility Art therapist granted to share information::  Yes, Verbal Permission Granted  Name::        Agency::     Relationship::     Contact Information:     Housing/Transportation Living arrangements for the past 2 months:  Apartment Source of Information:  Patient Patient Interpreter Needed:  None Criminal Activity/Legal Involvement Pertinent to Current Situation/Hospitalization:  No - Comment as needed Significant Relationships:  Adult Children Lives with:  Self Do you feel safe going back to the place where you live?   (SNF may be needed.) Need for family participation in patient care:  No (Coment)  Care giving concerns:  Pt may need more help at home than is available at home following hospital d/c.   Social Worker assessment / plan:  Pt hospitalized from home on 03/28/17 with  Left tibial plateau fracture. Surgery has been completed and PT recommendations are pending. CSW met with pt at bedside to assist with d/c planning. MD has recommended ST Rehab at d/c. Pt is considering this option. SNF search initiated and bed offers pending. CSW will continue to follow to assist with d/c planning.  Employment status:  Unemployed Nurse, adult PT Recommendations:  Not assessed at this time Information / Referral to community resources:  [place]  Patient/Family's Response to care:  Home vs SNF  Patient/Family's Understanding of and Emotional Response to Diagnosis, Current Treatment, and Prognosis:  Pt is aware of his medical status. MD has recommended SNF at d/c. Pt is motivated to work with therapy and is considering option for ST Rehab.  Emotional  Assessment Appearance:  Appears stated age Attitude/Demeanor/Rapport:  Other (cooperative) Affect (typically observed):  Appropriate, Pleasant Orientation:  Oriented to Self, Oriented to Place, Oriented to  Time, Oriented to Situation Alcohol / Substance use:  Not Applicable Psych involvement (Current and /or in the community):  No (Comment)  Discharge Needs  Concerns to be addressed:  Discharge Planning Concerns Readmission within the last 30 days:  No Current discharge risk:  None Barriers to Discharge:  No Barriers Identified   Luretha Rued, [place] 03/29/2017, 9:07 AM

## 2017-03-29 NOTE — [place] FL2 (Signed)
[place] MEDICAID FL2 LEVEL OF CARE SCREENING TOOL     IDENTIFICATION  Patient Name: Eugene [name] Birthdate: 1961-01-18 Sex: male Admission Date (Current Location): 03/27/2017  [facility] and [place] Number:  Producer, television/film/video and Address:  [facility],  [address] [place] [address]      Provider Number: [id number]  Attending Physician Name and Address:  Durene Romans, MD  Relative Name and Phone Number:       Current Level of Care: Hospital Recommended Level of Care: Skilled Nursing Facility Prior Approval Number:    Date Approved/Denied:   PASRR Number: [id number]  Discharge Plan: SNF    Current Diagnoses: Patient Active Problem List   Diagnosis Date Noted  . Overweight (BMI 25.0-29.9) 03/29/2017  . Tibial plateau fracture, left, closed, initial encounter 03/27/2017  . Current smoker 05/07/2014  . Partial seizure (HCC) 05/24/2013  . TIA (transient ischemic attack) 05/04/2013  . History of hemorrhagic stroke with residual hemiparesis (HCC) 05/04/2013  . Hypertension 05/04/2013  . History of drug abuse 05/04/2013  . Thrombocytopenia (HCC) 05/04/2013    Orientation RESPIRATION BLADDER Height & Weight     Self, Time, Situation, Place  Normal Continent Weight: 180 lb (81.6 kg) Height:  5\' 7"  (170.2 cm)  BEHAVIORAL SYMPTOMS/MOOD NEUROLOGICAL BOWEL NUTRITION STATUS  Other (Comment) (no behaviors)   Continent Diet (regular)  AMBULATORY STATUS COMMUNICATION OF NEEDS Skin   Limited Assist Verbally Surgical wounds                       Personal Care Assistance Level of Assistance  Bathing, Feeding, Dressing Bathing Assistance: Limited assistance Feeding assistance: Independent Dressing Assistance: Limited assistance     Functional Limitations Info  Sight, Hearing, Speech Sight Info: Adequate Hearing Info: Adequate Speech Info: Adequate    SPECIAL CARE FACTORS FREQUENCY  PT (By licensed PT), OT (By licensed OT)     PT Frequency:  5x wk OT Frequency: 5x wk            Contractures Contractures Info: Not present    Additional Factors Info  Code Status Code Status Info: Full Code             Current Medications (03/29/2017):  This is the current hospital active medication list Current Facility-Administered Medications  Medication Dose Route Frequency Provider Last Rate Last Dose  . 0.9 %  sodium chloride infusion   Intravenous Continuous Lanney Gins, PA-C 100 mL/hr at 03/28/17 1958    . alum & mag hydroxide-simeth (MAALOX/MYLANTA) 200-200-20 MG/5ML suspension 15 mL  15 mL Oral Q4H PRN Lanney Gins, PA-C      . aspirin chewable tablet 81 mg  81 mg Oral BID Lanney Gins, PA-C   81 mg at 03/29/17 0853  . bisacodyl (DULCOLAX) suppository 10 mg  10 mg Rectal Daily PRN Lanney Gins, PA-C      . celecoxib (CELEBREX) capsule 200 mg  200 mg Oral Q12H Lanney Gins, PA-C   200 mg at 03/29/17 0853  . dexamethasone (DECADRON) injection 10 mg  10 mg Intravenous Once Babish, Matthew, PA-C      . diphenhydrAMINE (BENADRYL) capsule 25 mg  25 mg Oral Q6H PRN Lanney Gins, PA-C   25 mg at 03/29/17 5621  . docusate sodium (COLACE) capsule 100 mg  100 mg Oral BID Lanney Gins, PA-C   100 mg at 03/29/17 3086  . ferrous sulfate tablet 325 mg  325 mg Oral TID PC Lanney Gins, PA-C  325 mg at 03/29/17 0853  . hydrochlorothiazide (HYDRODIURIL) tablet 25 mg  25 mg Oral Daily Durene Romanslin, Matthew, MD   25 mg at 03/29/17 [id number]  . HYDROcodone-acetaminophen (NORCO/VICODIN) 5-325 MG per tablet 1-2 tablet  1-2 tablet Oral Q4H PRN Durene Romanslin, Matthew, MD   2 tablet at 03/29/17 [phone]  . HYDROmorphone (DILAUDID) injection 0.5-2 mg  0.5-2 mg Intravenous Q2H PRN Lanney GinsBabish, Matthew, PA-C      . levETIRAcetam (KEPPRA) tablet 1,000 mg  1,000 mg Oral BID Durene Romanslin, Matthew, MD   1,000 mg at 03/29/17 [id number]  . losartan (COZAAR) tablet 100 mg  100 mg Oral Daily Durene Romanslin, Matthew, MD   100 mg at 03/29/17 [id number]  . magnesium citrate solution 1 Bottle  1 Bottle  Oral Once PRN Babish, Matthew, PA-C      . menthol-cetylpyridinium (CEPACOL) lozenge 3 mg  1 lozenge Oral PRN Lanney GinsBabish, Matthew, PA-C       Or  . phenol (CHLORASEPTIC) mouth spray 1 spray  1 spray Mouth/Throat PRN Babish, Matthew, PA-C      . methocarbamol (ROBAXIN) tablet 500 mg  500 mg Oral Q6H PRN Lanney GinsBabish, Matthew, PA-C       Or  . methocarbamol (ROBAXIN) 500 mg in dextrose 5 % 50 mL IVPB  500 mg Intravenous Q6H PRN Lanney GinsBabish, Matthew, PA-C 110 mL/hr at 03/28/17 1926 500 mg at 03/28/17 1926  . metoCLOPramide (REGLAN) tablet 5-10 mg  5-10 mg Oral Q8H PRN Lanney GinsBabish, Matthew, PA-C       Or  . metoCLOPramide (REGLAN) injection 5-10 mg  5-10 mg Intravenous Q8H PRN Babish, Matthew, PA-C      . ondansetron (ZOFRAN) tablet 4 mg  4 mg Oral Q6H PRN Lanney GinsBabish, Matthew, PA-C       Or  . ondansetron (ZOFRAN) injection 4 mg  4 mg Intravenous Q6H PRN Babish, Matthew, PA-C      . polyethylene glycol (MIRALAX / GLYCOLAX) packet 17 g  17 g Oral BID Lanney GinsBabish, Matthew, PA-C   17 g at 03/29/17 0853  . pravastatin (PRAVACHOL) tablet 20 mg  20 mg Oral Daily Durene Romanslin, Matthew, MD   20 mg at 03/29/17 0851  . verapamil (CALAN) tablet 80 mg  80 mg Oral TID Durene Romanslin, Matthew, MD   80 mg at 03/29/17 [phone]     Discharge Medications: Please see discharge summary for a list of discharge medications.  Relevant Imaging Results:  Relevant Lab Results:   Additional Information SS # [id number]  Darriel Sinquefield, Dickey GaveJamie Lee, LCSW

## 2017-03-29 NOTE — Progress Notes (Signed)
Plan for d/c to SNF, discharge planning per CSW. [phone] 

## 2017-03-29 NOTE — Progress Notes (Signed)
Physical Therapy Treatment Patient Details Name: Eugene [name] MRN: [id number] DOB: 11-Aug-1961 Today's Date: 03/29/2017    History of Present Illness Pt s/p fall with tib plateau fx.  Pt with hx of CVA with R side weakness     PT Comments    Pt motivated, still requiring multimodal cues for NWB on L LE but demonstrating marked improvement with compliance to same.   Follow Up Recommendations  SNF     Equipment Recommendations  None recommended by PT    Recommendations for Other Services OT consult     Precautions / Restrictions Precautions Precautions: Fall Required Braces or Orthoses: Other Brace/Splint Other Brace/Splint: KAFO on R Restrictions Weight Bearing Restrictions: No LLE Weight Bearing: Non weight bearing    Mobility  Bed Mobility               General bed mobility comments: Pt received on BSC and requests back to chair  Transfers Overall transfer level: Needs assistance Equipment used: Rolling walker (2 wheeled) Transfers: Sit to/from Stand Sit to Stand: Min assist;Mod assist         General transfer comment: cues for LE management and use of UEs to self assist  Ambulation/Gait Ambulation/Gait assistance: Min assist;+2 physical assistance;+2 safety/equipment Ambulation Distance (Feet): 14 Feet Assistive device: Rolling walker (2 wheeled) Gait Pattern/deviations: Step-to pattern;Decreased step length - right;Decreased step length - left;Shuffle Gait velocity: decr Gait velocity interpretation: Below normal speed for age/gender General Gait Details: cues for sequence, posture, NWB on L and position from RW.  Pt demonstrating marked improvement with NWB follow through   Stairs            Wheelchair Mobility    Modified Rankin (Stroke Patients Only)       Balance Overall balance assessment: Needs assistance Sitting-balance support: No upper extremity supported;Feet supported Sitting balance-Leahy Scale: Good     Standing  balance support: Bilateral upper extremity supported Standing balance-Leahy Scale: Fair                              Cognition Arousal/Alertness: Awake/alert Behavior During Therapy: WFL for tasks assessed/performed Overall Cognitive Status: Within Functional Limits for tasks assessed                                        Exercises      General Comments        Pertinent Vitals/Pain Pain Assessment: Faces Faces Pain Scale: Hurts a little bit Pain Location: L knee Pain Descriptors / Indicators: Sore Pain Intervention(s): Limited activity within patient's tolerance;Monitored during session;Premedicated before session    Home Living Family/patient expects to be discharged to:: Skilled nursing facility Living Arrangements: Alone                  Prior Function Level of Independence: Independent      Comments: Pt states he did not use AD prior to fall as long as he had KAFO on R   PT Goals (current goals can now be found in the care plan section) Acute Rehab PT Goals Patient Stated Goal: Regain IND PT Goal Formulation: With patient Time For Goal Achievement: 04/05/17 Potential to Achieve Goals: Good Progress towards PT goals: Progressing toward goals    Frequency    Min 4X/week      PT Plan Current plan remains appropriate  Co-evaluation              AM-PAC PT "6 Clicks" Daily Activity  Outcome Measure  Difficulty turning over in bed (including adjusting bedclothes, sheets and blankets)?: A Little Difficulty moving from lying on back to sitting on the side of the bed? : A Little Difficulty sitting down on and standing up from a chair with arms (e.g., wheelchair, bedside commode, etc,.)?: A Lot Help needed moving to and from a bed to chair (including a wheelchair)?: A Lot Help needed walking in hospital room?: A Lot Help needed climbing 3-5 steps with a railing? : A Lot 6 Click Score: 14    End of Session Equipment  Utilized During Treatment: Gait belt;Left knee immobilizer;Other (comment) Activity Tolerance: Patient tolerated treatment well Patient left: in chair;with call bell/phone within reach Nurse Communication: Mobility status PT Visit Diagnosis: Difficulty in walking, not elsewhere classified (R26.2);History of falling (Z91.81)     Time: 6962-9528 PT Time Calculation (min) (ACUTE ONLY): 14 min  Charges:  $Gait Training: 8-22 mins                    G Codes:       Pg [phone]    Wadie Mattie 03/29/2017, 2:16 PM

## 2017-03-29 NOTE — Evaluation (Signed)
Physical Therapy Evaluation Patient Details Name: Eugene [name] MRN: [id number] DOB: [birth year] Today's Date: 03/29/2017   History of Present Illness  Pt s/p fall with tib plateau fx.  Pt with hx of CVA with R side weakness   Clinical Impression  Pt s/p L tib plateau fx and presents with functional mobility limitations 2* decreased L LE strength/ROM, L LE NWB, and R LE weakness 2* previous CVA.  Pt would benefit from follow up Rehab at SNF level to maximize IND and safety prior to return home alone.    Follow Up Recommendations SNF    Equipment Recommendations  None recommended by PT    Recommendations for Other Services OT consult     Precautions / Restrictions Precautions Precautions: Fall Required Braces or Orthoses: Other Brace/Splint Other Brace/Splint: KAFO on R Restrictions Weight Bearing Restrictions: No LLE Weight Bearing: Non weight bearing      Mobility  Bed Mobility               General bed mobility comments: Pt recieved in bedside sitting.  CNA assisted to EOB 2* urinary incontinence in bed  Transfers Overall transfer level: Needs assistance Equipment used: Rolling walker (2 wheeled) Transfers: Sit to/from Stand Sit to Stand: Min assist;Mod assist;From elevated surface         General transfer comment: cues for LE management and use of UEs to self assist  Ambulation/Gait Ambulation/Gait assistance: Min assist;Mod assist;+2 safety/equipment Ambulation Distance (Feet): 10 Feet Assistive device: Rolling walker (2 wheeled) Gait Pattern/deviations: Step-to pattern;Decreased step length - right;Decreased step length - left;Shuffle Gait velocity: decr Gait velocity interpretation: Below normal speed for age/gender General Gait Details: cues for sequence, posture and position from RW.  Pt with difficulty complying with NWB  Stairs            Wheelchair Mobility    Modified Rankin (Stroke Patients Only)       Balance Overall balance  assessment: Needs assistance Sitting-balance support: No upper extremity supported;Feet supported Sitting balance-Leahy Scale: Good     Standing balance support: Bilateral upper extremity supported Standing balance-Leahy Scale: Fair                               Pertinent Vitals/Pain Pain Assessment: Faces Faces Pain Scale: Hurts a little bit Pain Location: L knee Pain Descriptors / Indicators: Sore Pain Intervention(s): Limited activity within patient's tolerance;Monitored during session;Premedicated before session;Ice applied    Home Living Family/patient expects to be discharged to:: Skilled nursing facility Living Arrangements: Alone                    Prior Function Level of Independence: Independent         Comments: Pt states he did not use AD prior to fall as long as he had KAFO on R     Hand Dominance        Extremity/Trunk Assessment   Upper Extremity Assessment Upper Extremity Assessment: RUE deficits/detail RUE Deficits / Details: 4/5 strength    Lower Extremity Assessment Lower Extremity Assessment: RLE deficits/detail;LLE deficits/detail RLE Deficits / Details: 3/5 at hip/knee and 2-/5 dorsiflex RLE Sensation: decreased proprioception RLE Coordination: decreased fine motor LLE Deficits / Details: Hip and ankle WFL, KI in place at knee    Cervical / Trunk Assessment Cervical / Trunk Assessment: Normal  Communication   Communication: No difficulties  Cognition Arousal/Alertness: Awake/alert Behavior During Therapy: WFL for tasks assessed/performed Overall Cognitive  Status: Within Functional Limits for tasks assessed                                        General Comments      Exercises     Assessment/Plan    PT Assessment Patient needs continued PT services  PT Problem List Decreased strength;Decreased range of motion;Decreased activity tolerance;Decreased balance;Decreased mobility;Decreased knowledge  of use of DME;Pain       PT Treatment Interventions DME instruction;Gait training;Functional mobility training;Therapeutic activities;Therapeutic exercise;Patient/family education    PT Goals (Current goals can be found in the Care Plan section)  Acute Rehab PT Goals Patient Stated Goal: Regain IND PT Goal Formulation: With patient Time For Goal Achievement: 04/05/17 Potential to Achieve Goals: Good    Frequency Min 4X/week   Barriers to discharge        Co-evaluation               AM-PAC PT "6 Clicks" Daily Activity  Outcome Measure Difficulty turning over in bed (including adjusting bedclothes, sheets and blankets)?: A Little Difficulty moving from lying on back to sitting on the side of the bed? : A Little Difficulty sitting down on and standing up from a chair with arms (e.g., wheelchair, bedside commode, etc,.)?: A Lot Help needed moving to and from a bed to chair (including a wheelchair)?: A Lot Help needed walking in hospital room?: A Lot Help needed climbing 3-5 steps with a railing? : A Lot 6 Click Score: 14    End of Session Equipment Utilized During Treatment: Gait belt;Left knee immobilizer;Other (comment) (KAFO on R) Activity Tolerance: Patient tolerated treatment well Patient left: in chair;with call bell/phone within reach Nurse Communication: Mobility status PT Visit Diagnosis: Difficulty in walking, not elsewhere classified (R26.2);History of falling (Z91.81)    Time: [id number] PT Time Calculation (min) (ACUTE ONLY): 20 min   Charges:   PT Evaluation $PT Eval Low Complexity: 1 Procedure     PT G Codes:        Pg [phone]   [name] 03/29/2017, 2:09 PM

## 2017-03-29 NOTE — Clinical Social Work Placement (Signed)
   CLINICAL SOCIAL WORK PLACEMENT  NOTE  Date:  03/29/2017  Patient Details  Name: Eugene [name] MRN: [id number] Date of Birth: [birth year]  Clinical Social Work is seeking post-discharge placement for this patient at the Skilled  Nursing Facility level of care (*CSW will initial, date and re-position this form in  chart as items are completed):  Yes   Patient/family provided with [facility] Clinical Social Work Department's list of facilities offering this level of care within the geographic area requested by the patient (or if unable, by the patient's family).  Yes   Patient/family informed of their freedom to choose among providers that offer the needed level of care, that participate in Medicare, Medicaid or managed care program needed by the patient, have an available bed and are willing to accept the patient.  Yes   Patient/family informed of [facility]'s ownership interest in [facility] and [facility], as well as of the fact that they are under no obligation to receive care at these facilities.  PASRR submitted to EDS on 03/29/17     PASRR number received on 03/29/17     Existing PASRR number confirmed on       FL2 transmitted to all facilities in geographic area requested by pt/family on 03/29/17     FL2 transmitted to all facilities within larger geographic area on       Patient informed that his/her managed care company has contracts with or will negotiate with certain facilities, including the following:            Patient/family informed of bed offers received.  Patient chooses bed at       Physician recommends and patient chooses bed at      Patient to be transferred to   on  .  Patient to be transferred to facility by       Patient family notified on   of transfer.  Name of family member notified:        PHYSICIAN       Additional Comment:    _______________________________________________ Royetta AsalHaidinger, Michall Noffke Lee, LCSW  [phone] 03/29/2017, 9:58  AM

## 2017-03-29 NOTE — Progress Notes (Signed)
     Subjective: 1 Day Post-Op Procedure(s) (LRB): OPEN REDUCTION INTERNAL FIXATION (ORIF) TIBIAL PLATEAU (Left)   Seen by Dr. Charlann Boxerlin. Patient reports pain as mild, pain controlled.  No events throughout the night.  Discharge disposition to be determined with how he progresses with PT.   Objective:   VITALS:   Vitals:   03/29/17 0420 03/29/17 0459  BP:  (!) 150/82  Pulse: 80 83  Resp: 15 17  Temp:  97.9 F (36.6 C)    Dorsiflexion/Plantar flexion intact Incision: dressing C/D/I No cellulitis present Compartment soft  LABS  Recent Labs  03/27/17 2007 03/28/17 0431 03/29/17 0426  HGB 12.9* 12.2* 10.8*  HCT 35.0* 33.3* 30.0*  WBC 16.4* 12.9* 13.2*  PLT 130* 74* 109*     Recent Labs  03/27/17 2007 03/28/17 0431 03/29/17 0426  NA 138 136 139  K 2.9* 4.5 3.5  BUN 14 15 13   CREATININE 0.94 0.91 1.03  GLUCOSE 105* 95 147*     Assessment/Plan: 1 Day Post-Op Procedure(s) (LRB): OPEN REDUCTION INTERNAL FIXATION (ORIF) TIBIAL PLATEAU (Left) Foley cath d/c'ed Advance diet Up with therapy D/C IV fluids Discharge home vs SNF  Overweight (BMI 25-29.9) Estimated body mass index is 28.19 kg/m as calculated from the following:   Height as of this encounter: 5\' 7"  (1.702 m).   Weight as of this encounter: 81.6 kg (180 lb). Patient also counseled that weight may inhibit the healing process Patient counseled that losing weight will help with future health issues        Anastasio AuerbachMatthew S. Lavanda Nevels   PAC  03/29/2017, 8:20 AM

## 2017-03-29 NOTE — Progress Notes (Signed)
OT Cancellation Note  Patient Details Name: Eugene [name] MRN: [id number] DOB: 10-May-1961   Cancelled Treatment:    Reason Eval/Treat Not Completed: Other (comment)  Noted plan for SNF- [facility] [place] [phone]  Alba CoryREDDING, [name] 03/29/2017, 12:10 PM

## 2017-03-30 DIAGNOSIS — I10 Essential (primary) hypertension: Secondary | ICD-10-CM | POA: Diagnosis not present

## 2017-03-30 DIAGNOSIS — S82142A Displaced bicondylar fracture of left tibia, initial encounter for closed fracture: Secondary | ICD-10-CM | POA: Diagnosis not present

## 2017-03-30 DIAGNOSIS — S82142D Displaced bicondylar fracture of left tibia, subsequent encounter for closed fracture with routine healing: Secondary | ICD-10-CM | POA: Diagnosis not present

## 2017-03-30 DIAGNOSIS — I69351 Hemiplegia and hemiparesis following cerebral infarction affecting right dominant side: Secondary | ICD-10-CM | POA: Diagnosis not present

## 2017-03-30 DIAGNOSIS — R262 Difficulty in walking, not elsewhere classified: Secondary | ICD-10-CM | POA: Diagnosis not present

## 2017-03-30 DIAGNOSIS — Z9181 History of falling: Secondary | ICD-10-CM | POA: Diagnosis not present

## 2017-03-30 DIAGNOSIS — Z87898 Personal history of other specified conditions: Secondary | ICD-10-CM | POA: Diagnosis not present

## 2017-03-30 DIAGNOSIS — Z7982 Long term (current) use of aspirin: Secondary | ICD-10-CM | POA: Diagnosis not present

## 2017-03-30 DIAGNOSIS — D696 Thrombocytopenia, unspecified: Secondary | ICD-10-CM | POA: Diagnosis not present

## 2017-03-30 DIAGNOSIS — R569 Unspecified convulsions: Secondary | ICD-10-CM | POA: Diagnosis not present

## 2017-03-30 DIAGNOSIS — M6281 Muscle weakness (generalized): Secondary | ICD-10-CM | POA: Diagnosis not present

## 2017-03-30 LAB — BASIC METABOLIC PANEL
Anion gap: 7 (ref 5–15)
BUN: 17 mg/dL (ref 6–20)
CHLORIDE: 104 mmol/L (ref 101–111)
CO2: 27 mmol/L (ref 22–32)
CREATININE: 0.89 mg/dL (ref 0.61–1.24)
Calcium: 8.6 mg/dL — ABNORMAL LOW (ref 8.9–10.3)
GFR calc Af Amer: >60 mL/min (ref >60)
GLUCOSE: 124 mg/dL — AB (ref 65–99)
Potassium: 3.7 mmol/L (ref 3.5–5.1)
SODIUM: 138 mmol/L (ref 135–145)

## 2017-03-30 LAB — CBC
HCT: 29 % — ABNORMAL LOW (ref 39.0–52.0)
Hemoglobin: 10.6 g/dL — ABNORMAL LOW (ref 13.0–17.0)
MCH: 30.6 pg (ref 26.0–34.0)
MCHC: 36.6 g/dL — AB (ref 30.0–36.0)
MCV: 83.8 fL (ref 78.0–100.0)
PLATELETS: 109 10*3/uL — AB (ref 150–400)
RBC: 3.46 MIL/uL — ABNORMAL LOW (ref 4.22–5.81)
RDW: 18.9 % — ABNORMAL HIGH (ref 11.5–15.5)
WBC: 17.4 10*3/uL — ABNORMAL HIGH (ref 4.0–10.5)

## 2017-03-30 MED ORDER — DOCUSATE SODIUM 100 MG PO CAPS: 100.0000 mg | ORAL_CAPSULE | Freq: Two times a day (BID) | ORAL | 0 refills | Status: DC

## 2017-03-30 MED ORDER — POLYETHYLENE GLYCOL 3350 17 G PO PACK: 17.0000 g | PACK | Freq: Two times a day (BID) | ORAL | 0 refills | Status: DC

## 2017-03-30 MED ORDER — METHOCARBAMOL 500 MG PO TABS: 500.0000 mg | ORAL_TABLET | Freq: Four times a day (QID) | ORAL | 0 refills | Status: DC | PRN

## 2017-03-30 MED ORDER — HYDROCODONE-ACETAMINOPHEN 5-325 MG PO TABS: 1.0000 | ORAL_TABLET | ORAL | 0 refills | Status: DC | PRN

## 2017-03-30 MED ORDER — ASPIRIN 81 MG PO CHEW: 81.0000 mg | CHEWABLE_TABLET | Freq: Two times a day (BID) | ORAL | 0 refills | Status: AC

## 2017-03-30 MED ORDER — FERROUS SULFATE 325 (65 FE) MG PO TABS: 325.0000 mg | ORAL_TABLET | Freq: Three times a day (TID) | ORAL | 3 refills | Status: DC

## 2017-03-30 NOTE — Progress Notes (Signed)
Physical Therapy Treatment Patient Details Name: Eugene MinersStepfon Keesling MRN: [id number] DOB: 1961/10/12 Today's Date: 03/30/2017    History of Present Illness Pt s/p fall with tib plateau fx.  Pt with hx of CVA with R side weakness     PT Comments    Pt progressing with mobility and eager for move to rehab.   Follow Up Recommendations  SNF     Equipment Recommendations  None recommended by PT    Recommendations for Other Services OT consult     Precautions / Restrictions Precautions Precautions: Fall Required Braces or Orthoses: Other Brace/Splint Other Brace/Splint: AFO on R Restrictions Weight Bearing Restrictions: Yes LLE Weight Bearing: Non weight bearing    Mobility  Bed Mobility               General bed mobility comments: Pt OOB and requests back to chair  Transfers Overall transfer level: Needs assistance Equipment used: Rolling walker (2 wheeled) Transfers: Sit to/from Stand Sit to Stand: Min assist         General transfer comment: cues for LE management and use of UEs to self assist  Ambulation/Gait Ambulation/Gait assistance: Min assist Ambulation Distance (Feet): 45 Feet (twice) Assistive device: Rolling walker (2 wheeled) Gait Pattern/deviations: Step-to pattern;Decreased step length - right;Decreased step length - left;Shuffle Gait velocity: decr Gait velocity interpretation: Below normal speed for age/gender General Gait Details: cues for sequence, posture, NWB on L and position from RW.  Pt demonstrating marked improvement with NWB follow through but still struggling to maintain on turning   Stairs            Wheelchair Mobility    Modified Rankin (Stroke Patients Only)       Balance Overall balance assessment: Needs assistance Sitting-balance support: No upper extremity supported;Feet supported Sitting balance-Leahy Scale: Good     Standing balance support: Bilateral upper extremity supported Standing balance-Leahy Scale:  Fair                              Cognition Arousal/Alertness: Awake/alert Behavior During Therapy: WFL for tasks assessed/performed Overall Cognitive Status: Within Functional Limits for tasks assessed                                        Exercises General Exercises - Lower Extremity Ankle Circles/Pumps: AROM;Both;15 reps;Supine Quad Sets: AROM;Both;15 reps;Supine Heel Slides: AAROM;Left;20 reps;Supine Hip ABduction/ADduction: AAROM;Left;15 reps;Supine Straight Leg Raises: AAROM;Left;10 reps    General Comments        Pertinent Vitals/Pain Pain Assessment: Faces Faces Pain Scale: Hurts a little bit Pain Location: L knee Pain Descriptors / Indicators: Sore Pain Intervention(s): Limited activity within patient's tolerance;Monitored during session;Premedicated before session    Home Living                      Prior Function            PT Goals (current goals can now be found in the care plan section) Acute Rehab PT Goals Patient Stated Goal: Regain IND PT Goal Formulation: With patient Time For Goal Achievement: 04/05/17 Potential to Achieve Goals: Good Progress towards PT goals: Progressing toward goals    Frequency    Min 4X/week      PT Plan Current plan remains appropriate    Co-evaluation  AM-PAC PT "6 Clicks" Daily Activity  Outcome Measure  Difficulty turning over in bed (including adjusting bedclothes, sheets and blankets)?: A Little Difficulty moving from lying on back to sitting on the side of the bed? : A Little Difficulty sitting down on and standing up from a chair with arms (e.g., wheelchair, bedside commode, etc,.)?: A Lot Help needed moving to and from a bed to chair (including a wheelchair)?: A Lot Help needed walking in hospital room?: A Lot Help needed climbing 3-5 steps with a railing? : A Lot 6 Click Score: 14    End of Session Equipment Utilized During Treatment: Gait  belt;Other (comment) Activity Tolerance: Patient tolerated treatment well Patient left: in chair;with call bell/phone within reach Nurse Communication: Mobility status PT Visit Diagnosis: Difficulty in walking, not elsewhere classified (R26.2);History of falling (Z91.81)     Time: 1610-9604 PT Time Calculation (min) (ACUTE ONLY): 31 min  Charges:  $Gait Training: 8-22 mins $Therapeutic Exercise: 8-22 mins                    G Codes:       Pg [phone]    Izzabell Klasen 03/30/2017, 1:08 PM

## 2017-03-30 NOTE — Progress Notes (Signed)
RN reviewed discharge instructions with patient.   Patient called because he left his brace for his right leg in his closet. Patient stated that brother will be by within the hour to pick it up.

## 2017-03-30 NOTE — Discharge Summary (Signed)
Physician Discharge Summary  Patient ID: Eugene MinersStepfon [name] MRN: [id number] DOB/AGE: [birth year] 56 y.o.  Admit date: 03/27/2017 Discharge date:  03/30/2017  Procedures:  Procedure(s) (LRB): OPEN REDUCTION INTERNAL FIXATION (ORIF) TIBIAL PLATEAU (Left)  Attending Physician:  Dr. Durene RomansMatthew Olin   Admission Diagnoses:   Left tibial plateau fracture   Discharge Diagnoses:  Active Problems:   Tibial plateau fracture, left, closed, initial encounter   Overweight (BMI 25.0-29.9)  Past Medical History:  Diagnosis Date  . Hypertension   . Seizures (HCC)   . Stroke [facility](HCC)     HPI:    Eugene Batesis a 56 y.o.malehistory of hypertension, previous stroke with residual right-sided weakness, who presented to the ER after a fall, sustaining left knee pain. Patient states that he has right-sided weakness at baseline and wears a brace at baseline and does not bear much weight on the right side. Patient states that he was walking at home yesterday and tripped and hit the left knee on a table. He states that he is trying to walk on it today and has progressive left knee pain and swelling. Denies syncope or head injury or other injuries. Dr. Charlann Boxerlin was called by the ED physician to evaluate the patient.  X-rays reveal a left tibial plateau fracture.  A CT scan of the left knee is ordered to evaluate the fracture further.  Risks, benefits and expectations were discussed with the patient. Dr. Charlann Boxerlin discussed with the patient that he would need to have this fixed surgically.  Risks including but not limited to the risk of anesthesia, blood clots, nerve damage, blood vessel damage, failure of the prosthesis, infection and up to and including death.  Patient understand the risks, benefits and expectations and wishes to proceed with surgery. Plan for an ORIF of the left tibial plateau fracture today, 03/28/2017.  PCP: Eugene DevonShelton, Kimberly, MD   Discharged Condition: good  Hospital Course:  Patient was admitted to the  hospital on 03/27/2017 and had an uneventful course until surgery.  Patient underwent the above stated procedure on 03/28/2017. Patient tolerated the procedure well and brought to the recovery room in good condition and subsequently to the floor.  POD #1 BP: 150/82 ; Pulse: 83 ; Temp: 97.9 F (36.6 C) ; Resp: 17 Patient reports pain as mild, pain controlled.  No events throughout the night.  Discharge disposition to be determined with how he progresses with PT. Dorsiflexion/plantar flexion intact, incision: dressing C/D/I, no cellulitis present and compartment soft.   LABS  Basename    HGB     10.8  HCT     30.0   POD #2  BP: 140/63 ; Pulse: 57 ; Temp: 98 F (36.7 C) ; Resp: 17 Patient reports pain as mild, states that he has no real pain.  Understands the reason for NWB.  No events throughout the night.  Ready to be discharged home. Dorsiflexion/plantar flexion intact, incision: dressing C/D/I, no cellulitis present and compartment soft.   LABS  Basename    HGB     10.6  HCT     29.0    Discharge Exam: General appearance: alert, cooperative and no distress Extremities: Homans sign is negative, no sign of DVT, no edema, redness or tenderness in the calves or thighs and no ulcers, gangrene or trophic changes  Disposition:   Skilled nursing facility with follow up in 2 weeks    Contact information for follow-up providers    Durene Romanslin, Dorice Stiggers, MD. Schedule an appointment as soon as possible  for a visit in 2 week(s).   Specialty:  Orthopedic Surgery Contact information: [address] [place] [address] [phone]            Contact information for after-discharge care    Destination    [facility] Follow up.   Specialty:  Skilled Nursing Facility Contact information: [address] [place] [address] [phone]                  Discharge Instructions    Call MD / Call 911    Complete by:  As  directed    If you experience chest pain or shortness of breath, CALL 911 and be transported to the hospital emergency room.  If you develope a fever above 101 F, pus (white drainage) or increased drainage or redness at the wound, or calf pain, call your surgeon's office.   Constipation Prevention    Complete by:  As directed    Drink plenty of fluids.  Prune juice may be helpful.  You may use a stool softener, such as Colace (over the counter) 100 mg twice a day.  Use MiraLax (over the counter) for constipation as needed.   Diet - low sodium heart healthy    Complete by:  As directed    Discharge instructions    Complete by:  As directed    Maintain surgical dressing until follow up in the clinic. If the edges start to pull up, may reinforce with tape. If the dressing is no longer working, may remove and cover with gauze and tape, but must keep the area dry and clean.  Follow up in 2 weeks at [facility]. Call with any questions or concerns.   Increase activity slowly as tolerated    Complete by:  As directed    Weight bearing as tolerated with assist device (walker, cane, etc) as directed, use it as long as suggested by your surgeon or therapist, typically at least 4-6 weeks.      Allergies as of 03/30/2017   No Known Allergies     Medication List    STOP taking these medications   aspirin EC 81 MG tablet Replaced by:  aspirin 81 MG chewable tablet     TAKE these medications   aspirin 81 MG chewable tablet Chew 1 tablet (81 mg total) by mouth 2 (two) times daily. Take for 4 weeks, then resume regular dose. Replaces:  aspirin EC 81 MG tablet   docusate sodium 100 MG capsule Commonly known as:  COLACE Take 1 capsule (100 mg total) by mouth 2 (two) times daily.   ferrous sulfate 325 (65 FE) MG tablet Take 1 tablet (325 mg total) by mouth 3 (three) times daily after meals.   HYDROcodone-acetaminophen 5-325 MG tablet Commonly known as:  NORCO/VICODIN Take 1-2 tablets  by mouth every 4 (four) hours as needed for moderate pain.   levETIRAcetam 1000 MG tablet Commonly known as:  KEPPRA Take 1 tablet (1,000 mg total) by mouth 2 (two) times daily.   losartan-hydrochlorothiazide 100-25 MG tablet Commonly known as:  HYZAAR Take 1 tablet by mouth every morning.   methocarbamol 500 MG tablet Commonly known as:  ROBAXIN Take 1 tablet (500 mg total) by mouth every 6 (six) hours as needed for muscle spasms.   multivitamin with minerals Tabs tablet Take 1 tablet by mouth every morning.   polyethylene glycol packet Commonly known as:  MIRALAX / GLYCOLAX Take 17 g by  mouth 2 (two) times daily.   pravastatin 20 MG tablet Commonly known as:  PRAVACHOL Take 20 mg by mouth every morning.   verapamil 80 MG tablet Commonly known as:  CALAN Take 80 mg by mouth 3 (three) times daily.            Durable Medical Equipment        Start     Ordered   03/28/17 2012  DME Walker rolling  Once    Question:  Patient needs a walker to treat with the following condition  Answer:  Tibial plateau fracture, left   03/28/17 2011   03/28/17 2012  DME 3 n 1  Once     03/28/17 2011       Signed: Anastasio Auerbach. Manolo Bosket   PA-C  03/30/2017, 1:16 PM

## 2017-03-30 NOTE — Progress Notes (Signed)
RN called report to Raphael GibneyPatricia Martin, LPN at [facility]. All questions answered.   Paperwork and prescriptions sent with patient.   NT rolled patient down with all belongings to family car.

## 2017-03-30 NOTE — Clinical Social Work Placement (Signed)
   CLINICAL SOCIAL WORK PLACEMENT  NOTE  Date:  03/30/2017  Patient Details  Name: Eugene [name] MRN: [id number] Date of Birth: [birth year]  Clinical Social Work is seeking post-discharge placement for this patient at the Skilled  Nursing Facility level of care (*CSW will initial, date and re-position this form in  chart as items are completed):  Yes   Patient/family provided with [facility] Clinical Social Work Department's list of facilities offering this level of care within the geographic area requested by the patient (or if unable, by the patient's family).  Yes   Patient/family informed of their freedom to choose among providers that offer the needed level of care, that participate in Medicare, Medicaid or managed care program needed by the patient, have an available bed and are willing to accept the patient.  Yes   Patient/family informed of [facility] ownership interest in [facility] and [facility], as well as of the fact that they are under no obligation to receive care at these facilities.  PASRR submitted to EDS on 03/29/17     PASRR number received on 03/29/17     Existing PASRR number confirmed on       FL2 transmitted to all facilities in geographic area requested by pt/family on 03/29/17     FL2 transmitted to all facilities within larger geographic area on       Patient informed that his/her managed care company has contracts with or will negotiate with certain facilities, including the following:        Yes   Patient/family informed of bed offers received.  Patient [facility] bed [facility]     Physician recommends and patient chooses bed at      Patient to be transferred to [facility] on 03/30/17.  Patient to be transferred to facility by [facility]     Patient family notified on 03/30/17 of transfer.  Name of family member notified:  Pt contacted family directly.     PHYSICIAN       Additional Comment: Pt is in  agreement with plan to dc to [facility] today. Pt now feels he can transport by car. Pt's brother will provide transportation. D/C Summary sent to SNF for review. Scripts included in d/c packet. # for report provided to nsg. D/C packet provided to pt.   _______________________________________________ Royetta AsalHaidinger, Sahirah Rudell Lee, LCSW  [phone] 03/30/2017, 1:59 PM

## 2017-03-30 NOTE — Progress Notes (Signed)
     Subjective: 2 Days Post-Op Procedure(s) (LRB): OPEN REDUCTION INTERNAL FIXATION (ORIF) TIBIAL PLATEAU (Left)   Patient reports pain as mild, states that he has no real pain.  Understands the reason for NWB.  No events throughout the night.  Ready to be discharged home.  Objective:   VITALS:   Vitals:   03/29/17 2138 03/30/17 0438  BP: (!) 156/74 140/63  Pulse: 76 (!) 57  Resp: 16 17  Temp: 97.9 F (36.6 C) 98 F (36.7 C)    Dorsiflexion/Plantar flexion intact Incision: dressing C/D/I No cellulitis present Compartment soft  LABS  Recent Labs  03/28/17 0431 03/29/17 0426 03/30/17 0417  HGB 12.2* 10.8* 10.6*  HCT 33.3* 30.0* 29.0*  WBC 12.9* 13.2* 17.4*  PLT 74* 109* 109*     Recent Labs  03/28/17 0431 03/29/17 0426 03/30/17 0417  NA 136 139 138  K 4.5 3.5 3.7  BUN 15 13 17   CREATININE 0.91 1.03 0.89  GLUCOSE 95 147* 124*     Assessment/Plan: 2 Days Post-Op Procedure(s) (LRB): OPEN REDUCTION INTERNAL FIXATION (ORIF) TIBIAL PLATEAU (Left) Up with therapy Discharge to SNF  [facility]. Follow up with OLIN,Brita Jurgensen D in 2 weeks.  Contact information:  [facility] [address] [place] [id number] [phone]    Overweight (BMI 25-29.9) Estimated body mass index is 28.19 kg/m as calculated from the following:   Height as of this encounter: 5\' 7"  (1.702 m).   Weight as of this encounter: 81.6 kg (180 lb). Patient also counseled that weight may inhibit the healing process Patient counseled that losing weight will help with future health issues        Anastasio AuerbachMatthew S. Tinamarie Przybylski   PAC  03/30/2017, 1:12 PM

## 2017-04-04 ENCOUNTER — Encounter: Payer: Self-pay | Admitting: Internal Medicine

## 2017-04-04 ENCOUNTER — Non-Acute Institutional Stay (SKILLED_NURSING_FACILITY): Payer: Medicare Other | Admitting: Internal Medicine

## 2017-04-04 DIAGNOSIS — S82142A Displaced bicondylar fracture of left tibia, initial encounter for closed fracture: Secondary | ICD-10-CM

## 2017-04-04 DIAGNOSIS — Z87898 Personal history of other specified conditions: Secondary | ICD-10-CM

## 2017-04-04 DIAGNOSIS — F1911 Other psychoactive substance abuse, in remission: Secondary | ICD-10-CM

## 2017-04-04 NOTE — Assessment & Plan Note (Addendum)
04/04/17 patient has very judicious as to his ingestion of narcotics postoperatively. Risk of addiction and respiratory suppression discussed with him and his daughter. Clinically I feel there is little risk of abuse based on the history patient provides & his obvious motivation to avoid regular ingestion of opiods

## 2017-04-04 NOTE — Progress Notes (Signed)
NURSING HOME LOCATION:  [facility] ROOM NUMBER:  223 CODE STATUS:  Full code  This is a comprehensive admission note to [facility] performed on this date less than 30 days from date of admission. Included are preadmission medical/surgical history;reconciled medication list; family history; social history and comprehensive review of systems.  Corrections and additions to the records were documented . Comprehensive physical exam was also performed. Additionally a clinical summary was entered for each active diagnosis pertinent to this admission in the Problem List to enhance continuity of care.  PCP: Hope Pigeon  HPI: The patient was hospitalized 5/21-5/24/18 for open reduction internal fixation (ORIF) left tibial plateau by Dr.Olin on 5/22. The patient presented to the ED after a fall in the context of previous stroke with associated right-sided weakness. It was a mechanical fall in which he tripped on a runner striking the left knee on a table. There was no cardiac or neurologic prodrome. X-rays revealed left tibial plateau fracture. Glucoses ranged 124-147. He denies diabetes. At discharge hemoglobin 10.6/hematocrit 29  Past medical and surgical history: Includes essential hypertension, and history of seizures. Last seizure was in 2015. He has had hernia repair.  Social history: Smokes 2-3 cigarettes a day. Drinks on average one drink per week. 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 He was very forthright and communicative about this.  Family history: Reviewed, additionally paternal grandmother and paternal grandfather both had strokes. There is no family history myocardial infarction.  Review of systems: He states that his pain is much better and he averages taking one narcotic pill a day at this time, usually in the evening.He has had 2 falls in the context of his previous stroke since he's been in the SNF. One was related to a  delay in getting to the bathroom. The other he simply slid out of the wheelchair. He will follow PT/OT guidelines as to mobilization. Constitutional: No fever,significant weight change, fatigue  Eyes: No redness, discharge, pain, vision change ENT/mouth: No nasal congestion,  purulent discharge, earache,change in hearing ,sore throat  Cardiovascular: No chest pain, palpitations,paroxysmal nocturnal dyspnea, claudication, edema  Respiratory: No cough, sputum production,hemoptysis, DOE , significant snoring,apnea  Gastrointestinal: No heartburn,dysphagia,abdominal pain, nausea / vomiting,rectal bleeding, melena,change in bowels Genitourinary: No dysuria,hematuria, pyuria,  incontinence, nocturia Dermatologic: No rash, pruritus, change in appearance of skin Neurologic: No dizziness,headache,syncope, seizures, numbness , tingling Psychiatric: No significant anxiety , depression, insomnia, anorexia Endocrine: No change in hair/skin/ nails, excessive thirst, excessive hunger, excessive urination  Hematologic/lymphatic: No significant bruising, lymphadenopathy,abnormal bleeding Allergy/immunology: No itchy/ watery eyes, significant sneezing, urticaria, angioedema  Physical exam:  Pertinent or positive findings: Head is shaven. Sclerae are muddy. There is slight exotropia of the left eye. He has a rigid brace of the right lower extremity with atrophy of the musculature. A soft brace is present on the left. There is a remote scar of the right knee. He also has a punctate scar with eschar formation over the right superior knee. Pedal pulses are decreased.   General appearance:Adequately nourished; no acute distress , increased work of breathing is present.   Lymphatic: No lymphadenopathy about the head, neck, axilla . Eyes: No conjunctival inflammation or lid edema is present. There is no scleral icterus. Ears:  External ear exam shows no significant lesions or deformities.   Nose:  External nasal  examination shows no deformity or inflammation. Nasal mucosa are pink and moist without lesions ,exudates Oral exam: lips and gums are  healthy appearing.There is no oropharyngeal erythema or exudate . Neck:  No thyromegaly, masses, tenderness noted.    Heart:  Normal rate and regular rhythm. S1 and S2 normal without gallop, murmur, click, rub .  Lungs:Chest clear to auscultation without wheezes, rhonchi,rales , rubs. Abdomen:Bowel sounds are normal. Abdomen is soft and nontender with no organomegaly, hernias,masses. GU: deferred  Extremities:  No cyanosis, clubbing,edema  Neurologic exam : Balance,Rhomberg,finger to nose testing could not be completed due to clinical state Skin: Warm & dry w/o tenting. No significant lesions or rash.  See clinical summary under each active problem in the Problem List with associated updated therapeutic plan

## 2017-04-04 NOTE — Assessment & Plan Note (Signed)
Follow-up postop orthopedic appointment will be scheduled

## 2017-04-04 NOTE — Patient Instructions (Signed)
See assessment and plan under each diagnosis in the problem list and acutely for this visit 

## 2017-04-07 DIAGNOSIS — S82122D Displaced fracture of lateral condyle of left tibia, subsequent encounter for closed fracture with routine healing: Secondary | ICD-10-CM | POA: Diagnosis not present

## 2017-04-07 NOTE — Addendum Note (Signed)
Addendum  created 04/07/17 1300 by Shelton SilvasHollis, Alnisa Hasley D, MD   Sign clinical note

## 2017-04-18 ENCOUNTER — Ambulatory Visit: Payer: Medicare Other | Attending: Orthopedic Surgery | Admitting: Physical Therapy

## 2017-04-18 ENCOUNTER — Encounter: Payer: Self-pay | Admitting: Physical Therapy

## 2017-04-18 DIAGNOSIS — M25662 Stiffness of left knee, not elsewhere classified: Secondary | ICD-10-CM | POA: Diagnosis not present

## 2017-04-18 DIAGNOSIS — M25562 Pain in left knee: Secondary | ICD-10-CM | POA: Diagnosis not present

## 2017-04-18 DIAGNOSIS — M6281 Muscle weakness (generalized): Secondary | ICD-10-CM | POA: Diagnosis not present

## 2017-04-18 DIAGNOSIS — G8929 Other chronic pain: Secondary | ICD-10-CM | POA: Insufficient documentation

## 2017-04-18 DIAGNOSIS — R2689 Other abnormalities of gait and mobility: Secondary | ICD-10-CM | POA: Insufficient documentation

## 2017-04-18 NOTE — Therapy (Signed)
[facility] Rehabilitation [facility] [address] [place], [place], [address] Phone: [phone]   Fax:  [phone]  Physical Therapy Evaluation  Patient Details  Name: Eugene [name] MRN: [id number] Date of Birth: 01/10/1961 Referring Provider: Durene Romans MD  Encounter Date: 04/18/2017      PT End of Session - 04/18/17 1149    Visit Number 1   Number of Visits 17   Date for PT Re-Evaluation 06/13/17   PT Start Time 1100   PT Stop Time 1146   PT Time Calculation (min) 46 min   Activity Tolerance Patient tolerated treatment well   Behavior During Therapy [facility] for tasks assessed/performed      Past Medical History:  Diagnosis Date  . Hypertension   . Seizures (HCC)   . Stroke [facility])     Past Surgical History:  Procedure Laterality Date  . HERNIA REPAIR    . ORIF TIBIA PLATEAU Left 03/28/2017   Procedure: OPEN REDUCTION INTERNAL FIXATION (ORIF) TIBIAL PLATEAU;  Surgeon: Durene Romans, MD;  Location: [facility];  Service: Orthopedics;  Laterality: Left;    There were no vitals filed for this visit.       Subjective Assessment - 04/18/17 1105    Subjective pt is a 56 y.o s/p L tibial plateau ORIF surgery  on 03/28/2017 due to a fracture from a fall hitting a table that occure 3 weeks ago. since the surgery things are going fine, pt has some fluid coming from staples. reports some pain from walking and weight bearing on it. reports he knows to limited weight bearing status but currently use a SPC for ambulation. denies N/T and only pain in the knee.    Limitations Sitting;Walking;Lifting   How long can you sit comfortably? 1 hour   How long can you stand comfortably? 30 min   How long can you walk comfortably? 10-15 min   Diagnostic tests x-ray on last visit   Patient Stated Goals to get back walking, strengthen the L leg   Currently in Pain? Yes   Pain Score 7   last took aspirin for pain at 8am, at worst 9/10   Pain Location Leg   Pain  Orientation Left   Pain Descriptors / Indicators Throbbing;Aching   Pain Type Surgical pain   Pain Onset More than a month ago   Pain Frequency Intermittent   Aggravating Factors  standing/ walking   Pain Relieving Factors aspirin,             [facility] PT Assessment - 04/18/17 1112      Assessment   Medical Diagnosis L s/p ORIF of tibial plateau   Referring Provider Durene Romans MD   Onset Date/Surgical Date 03/28/17   Hand Dominance Right   Next MD Visit --  05/03/2017   Prior Therapy yes  for the stroke     Precautions   Precaution Comments take it easy, avoid excessive movement   Required Braces or Orthoses --  AFO on R due to hx of stroke     Restrictions   Weight Bearing Restrictions Yes   LUE Weight Bearing Non weight bearing     Balance Screen   Has the patient fallen in the past 6 months Yes   How many times? 2   Has the patient had a decrease in activity level because of a fear of falling?  No   Is the patient reluctant to leave their home because of a fear of falling?  No  Home Environment   Living Environment Private residence   Living Arrangements Alone   Type of Home Apartment   Home Access Level entry   Home Layout One level   Home Equipment [place] - single point;Walker - 2 wheels;Shower seat  AFO for R foot drop     Prior Function   Level of Independence Independent;Independent with basic ADLs   Vocation Part time employment  ticket collector at [employer]   Vocation Requirements prolonged standing   Leisure watching sports     Cognition   Overall Cognitive Status Within Functional Limits for tasks assessed     Observation/Other Assessments   Observations incision is covered with taped toilet paper. both incisions have opened and were bleeding. minimal purulent discharge with knee being warm to the touch pt report " my leg feels like it has a fever"   Focus on Therapeutic Outcomes (FOTO)  61% limited  predicted 40% limited      Posture/Postural Control   Posture/Postural Control Postural limitations   Postural Limitations Rounded Shoulders;Forward head     ROM / Strength   AROM / PROM / Strength AROM;Strength;PROM     AROM   AROM Assessment Site Knee   Right/Left Knee Right;Left   Left Knee Extension -20   Left Knee Flexion 105     PROM   PROM Assessment Site Knee   Right/Left Knee Left   Left Knee Extension -18   Left Knee Flexion 108     Strength   Overall Strength Due to precautions;Unable to assess     Palpation   Palpation comment tenderness along bil plateaus and incision. knee warm to the touch compared bil,             Objective measurements completed on examination: See above findings.          [facility] Adult PT Treatment/Exercise - 04/18/17 1112      Self-Care   Self-Care Other Self-Care Comments   Other Self-Care Comments  avoiding getting the incision wet, keeping the area clean,  signs/ symptoms of infection  return to MD for assessment of possible infection     Knee/Hip Exercises: Seated   Other Seated Knee/Hip Exercises heel/ toe raise 2 x 10     Knee/Hip Exercises: Supine   Quad Sets 1 set;10 reps;Left;Strengthening  with 5 sec hold   Heel Slides 1 set;10 reps  verbal cues to stay within pain free ROm     Manual Therapy   Manual Therapy Other (comment)   Other Manual Therapy cleaned incison area and re-applied new gauze / bandages                PT Education - 04/18/17 1147    Education provided Yes   Education Details evaluation findings, POC, goals, HEP with proper form. Discussed at length of benefits of using RW and precautions that he should not be putting weight through the LLE. Signs of possible infection and that he needs to contact his MD and get as soon as possible.    Person(s) Educated Patient   Methods Explanation;Verbal cues   Comprehension Verbalized understanding;Verbal cues required          PT Short Term Goals - 04/18/17 1204       PT SHORT TERM GOAL #1   Title pt will be I with inital HEP (05/18/2017)   Time 4   Period Weeks   Status New     PT SHORT TERM GOAL #2  Title pt will be able to verbalize / demo proper precuations and use of appropriate DME for safety and compliance ( 05/18/2017)   Time 4   Period Weeks   Status New     PT SHORT TERM GOAL #3   Title pt will demo techniques to prevent and reduce pain and edema/inflammation via RICE and HEP (05/18/2017)   Time 4   Period Weeks   Status New     PT SHORT TERM GOAL #4   Title he will increase L knee flexion / extension by >/= 5 degrees to promote functional progression (05/18/2017)   Time 4   Period Weeks   Status New           PT Long Term Goals - 04/18/17 1207      PT LONG TERM GOAL #1   Title pt will improve knee flexion to >/= 120 degrees and extension to </= -3 degrees with </= 2/10 pain to promote functional and efficient gait pattern (06/13/2017)   Time 8   Period Weeks   Status New     PT LONG TERM GOAL #2   Title pt will increase hip/ knee strength in LLE to >/= 4/5 to promote hip/ knee stability and promote safety with walking/ standing (06/13/2017)   Time 8   Period Weeks   Status New     PT LONG TERM GOAL #3   Title pt will be able to walk/ standing with LRAD for >/= 30 min with </= 2/10 pain for functional endurance required for ADLs and work related activities (06/13/2017)   Time 8   Period Weeks   Status New     PT LONG TERM GOAL #4   Title increase FOTO score to </= 40% limited to demo improvement in function (06/13/2017)   Time 8   Period Weeks   Status New                Plan - 04/18/17 1151    Clinical Impression Statement Mr. Nelis presents to OPPT with CC of L knee pain s/p L ORIF on 03/28/2017 due falling backward and hitting his knee on a table. pt is non-compliant to NWB precautions and arrived to treatment using SPC. pt exhibits limited knee AROM/ PROm, strength was not assessed due to precautions. the  incision is open and bleeding slight exhibiting signs of possible infection with minimal purulent discharge and warm to the touch. He would benefit from physical therapy to improve knee mobility, reduce pain, increase strength, proper DME use / precautions and maximize his function by addressing the deficits listed.    History and Personal Factors relevant to plan of care: involved PMHx, hx or stroke and seizures, age,   Clinical Presentation Evolving   Clinical Presentation due to: possible sign of infection. limited knee mobility, limited strength, abnormal gait, no aderence to precautions   Clinical Decision Making Moderate   Rehab Potential Fair   Clinical Impairments Affecting Rehab Potential hx or previous stroke RLE involved   PT Frequency 2x / week   PT Duration 8 weeks   PT Treatment/Interventions ADLs/Self Care Home Management;Electrical Stimulation;Cryotherapy;Moist Heat;Ultrasound;Dry needling;Taping;Manual techniques;Patient/family education;Gait training;Functional mobility training;Therapeutic exercise;Therapeutic activities;Passive range of motion;Neuromuscular re-education;Vasopneumatic Device   PT Next Visit Plan assess/ review HEP, did he go back to MD for possible infection, knee mobs/ AROM, strengthening, modalities PRn   PT Home Exercise Plan seated heel/ toe raise, heel slides, quad set   Consulted and Agree with Plan of Care Patient  Patient will benefit from skilled therapeutic intervention in order to improve the following deficits and impairments:  Abnormal gait, Pain, Improper body mechanics, Postural dysfunction, Increased edema, Decreased strength, Decreased endurance, Decreased activity tolerance, Decreased balance, Decreased range of motion, Increased muscle spasms, Difficulty walking  Visit Diagnosis: Chronic pain of left knee - Plan: PT plan of care cert/re-cert  Stiffness of left knee, not elsewhere classified - Plan: PT plan of care cert/re-cert  Muscle  weakness (generalized) - Plan: PT plan of care cert/re-cert  Other abnormalities of gait and mobility - Plan: PT plan of care cert/re-cert      G-Codes - 2017/05/06 1213    Functional Assessment Tool Used (Outpatient Only) clinical judgement   Functional Limitation Mobility: Walking and moving around   Mobility: Walking and Moving Around Current Status (W0981) At least 60 percent but less than 80 percent impaired, limited or restricted   Mobility: Walking and Moving Around Goal Status [phone]) At least 20 percent but less than 40 percent impaired, limited or restricted       Problem List Patient Active Problem List   Diagnosis Date Noted  . Overweight (BMI 25.0-29.9) 03/29/2017  . Tibial plateau fracture, left, closed, initial encounter 03/27/2017  . Current smoker 05/07/2014  . Partial seizure (HCC) 05/24/2013  . TIA (transient ischemic attack) 05/04/2013  . History of hemorrhagic stroke with residual hemiparesis (HCC) 05/04/2013  . Hypertension 05/04/2013  . History of drug abuse 05/04/2013  . Thrombocytopenia (HCC) 05/04/2013   Lulu Riding PT, DPT, LAT, ATC  05-06-2017  12:17 PM      [facility] [address] [place], [place], [address] Phone: [phone]   Fax:  [phone]  Name: Eugene [name] MRN: [id number] Date of Birth: [birth year]

## 2017-04-26 ENCOUNTER — Ambulatory Visit: Payer: Medicare Other | Admitting: Physical Therapy

## 2017-04-26 DIAGNOSIS — M25662 Stiffness of left knee, not elsewhere classified: Secondary | ICD-10-CM

## 2017-04-26 DIAGNOSIS — M6281 Muscle weakness (generalized): Secondary | ICD-10-CM

## 2017-04-26 DIAGNOSIS — G8929 Other chronic pain: Secondary | ICD-10-CM | POA: Diagnosis not present

## 2017-04-26 DIAGNOSIS — R2689 Other abnormalities of gait and mobility: Secondary | ICD-10-CM

## 2017-04-26 DIAGNOSIS — M25562 Pain in left knee: Secondary | ICD-10-CM | POA: Diagnosis not present

## 2017-04-26 NOTE — Patient Instructions (Signed)
   Copyright  VHI. All rights reserved.  HIP: Flexion / KNEE: Extension, Straight Leg Raise   Raise leg, keeping knee straight. Perform slowly. _20__ reps per set, 1-2___ sets per day, _2__ days per week       Raise leg until knee is straight. __20_ reps per set, _2__ sets per day, 7___ days per week  Copyright  VHI. All rights reserved.  Short Arc [employer]Quad   Place a large can or rolled towel under leg. Straighten knee and leg. Hold _5___ seconds. Repeat with other leg. Repeat 20____ times. Do __2__ sessions per day.  http://gt2.exer.us/365   Copyright  VHI. All rights reserved.  Quad Set   Slowly tighten muscles on thigh of straight leg while counting out loud to __5__. Repeat __10__ times. Do __2__ sessions per day.

## 2017-04-26 NOTE — Therapy (Signed)
[place] [facility] [address] [place], [place], [address] Phone: [phone]  [facility] Fax:  [phone]  Physical Therapy Treatment  Patient Details  Name: Eugene MinersStepfon Dantes MRN: [id number] Date of Birth: 03-Dec-1960 Referring Provider: Durene RomansMatthew Olin MD  Encounter Date: 04/26/2017      PT End of Session - 04/26/17 1310    Visit Number 2   Number of Visits 17   Date for PT Re-Evaluation 06/13/17   PT Start Time 0100   PT Stop Time 0143   PT Time Calculation (min) 43 min      Past Medical History:  Diagnosis Date  . Hypertension   . Seizures (HCC)   . Stroke [facility](HCC)     Past Surgical History:  Procedure Laterality Date  . HERNIA REPAIR    . ORIF TIBIA PLATEAU Left 03/28/2017   Procedure: OPEN REDUCTION INTERNAL FIXATION (ORIF) TIBIAL PLATEAU;  Surgeon: Durene Romanslin, Matthew, MD;  [facility];  Service: Orthopedics;  Laterality: Left;    There were no vitals filed for this visit.      Subjective Assessment - 04/26/17 1303    Subjective My knee hurts at night.    Currently in Pain? Yes   Pain Score 2    Pain Location Leg   Pain Orientation Left   Pain Descriptors / Indicators Aching;Throbbing   Aggravating Factors  at night, cold air, prolonged knee extension    Pain Relieving Factors aspirin             [facility] PT Assessment - 04/26/17 0001      AROM   Left Knee Flexion 112                     [facility] Adult PT Treatment/Exercise - 04/26/17 0001      Knee/Hip Exercises: Stretches   Active Hamstring Stretch 3 reps;30 seconds     Knee/Hip Exercises: Aerobic   Nustep L4 x 10 minutes, LE only      Knee/Hip Exercises: Standing   Knee Flexion 10 reps   Hip Flexion 10 reps   Hip Abduction 10 reps   Hip Extension 10 reps     Knee/Hip Exercises: Seated   Long Arc Quad 20 reps   Other Seated Knee/Hip Exercises heel/ toe raise 2 x 10     Knee/Hip Exercises: Supine   Quad Sets 1 set;10 reps;Left;Strengthening   with 5 sec hold   Short Arc [employer]ad Sets 20 reps   Straight Leg Raises 10 reps;2 sets                PT Education - 04/26/17 1341    Education provided Yes   Education Details HEP   Person(s) Educated Patient   Methods Explanation;Handout   Comprehension Verbalized understanding          PT Short Term Goals - 04/18/17 1204      PT SHORT TERM GOAL #1   Title pt will be I with inital HEP (05/18/2017)   Time 4   Period Weeks   Status New     PT SHORT TERM GOAL #2   Title pt will be able to verbalize / demo proper precuations and use of appropriate DME for safety and compliance ( 05/18/2017)   Time 4   Period Weeks   Status New     PT SHORT TERM GOAL #3   Title pt will demo techniques to prevent and reduce pain and edema/inflammation via RICE and HEP (05/18/2017)  Time 4   Period Weeks   Status New     PT SHORT TERM GOAL #4   Title he will increase L knee flexion / extension by >/= 5 degrees to promote functional progression (05/18/2017)   Time 4   Period Weeks   Status New           PT Long Term Goals - 04/18/17 1207      PT LONG TERM GOAL #1   Title pt will improve knee flexion to >/= 120 degrees and extension to </= -3 degrees with </= 2/10 pain to promote functional and efficient gait pattern (06/13/2017)   Time 8   Period Weeks   Status New     PT LONG TERM GOAL #2   Title pt will increase hip/ knee strength in LLE to >/= 4/5 to promote hip/ knee stability and promote safety with walking/ standing (06/13/2017)   Time 8   Period Weeks   Status New     PT LONG TERM GOAL #3   Title pt will be able to walk/ standing with LRAD for >/= 30 min with </= 2/10 pain for functional endurance required for ADLs and work related activities (06/13/2017)   Time 8   Period Weeks   Status New     PT LONG TERM GOAL #4   Title increase FOTO score to </= 40% limited to demo improvement in function (06/13/2017)   Time 8   Period Weeks   Status New                Plan - 04/26/17 1311    Clinical Impression Statement Pt reports difficulty with maintaining LLE NWB. He arrives with RW folded up and pushing it with LUE. He reports mostly using SPC. His knee is less swollen per his report. He has one area along scar that has a scab. He reports mininmal compliance to HEP. Repeated education regarding NWB status and its importance for healing. Pt sees MD in 2 weeks for follow up. Progressed HEP and provided education on improtance of the HEP.   PT Next Visit Plan assess/ review HEP, did he go back to MD for possible infection, knee mobs/ AROM, strengthening, modalities PRn   PT Home Exercise Plan seated heel/ toe raise, heel slides, quad set   Consulted and Agree with Plan of Care Patient      Patient will benefit from skilled therapeutic intervention in order to improve the following deficits and impairments:  Abnormal gait, Pain, Improper body mechanics, Postural dysfunction, Increased edema, Decreased strength, Decreased endurance, Decreased activity tolerance, Decreased balance, Decreased range of motion, Increased muscle spasms, Difficulty walking  Visit Diagnosis: Chronic pain of left knee  Stiffness of left knee, not elsewhere classified  Muscle weakness (generalized)  Other abnormalities of gait and mobility     Problem List Patient Active Problem List   Diagnosis Date Noted  . Overweight (BMI 25.0-29.9) 03/29/2017  . Tibial plateau fracture, left, closed, initial encounter 03/27/2017  . Current smoker 05/07/2014  . Partial seizure (HCC) 05/24/2013  . TIA (transient ischemic attack) 05/04/2013  . History of hemorrhagic stroke with residual hemiparesis (HCC) 05/04/2013  . Hypertension 05/04/2013  . History of drug abuse 05/04/2013  . Thrombocytopenia (HCC) 05/04/2013    Sherrie Mustache, PTA 04/26/2017, 1:48 PM  [facility] [address] [place], [place], [address] Phone:  [phone]   Fax:  [phone]  Name: Lourdes Manning MRN: [id number] Date of Birth: 03/09/1961

## 2017-05-01 ENCOUNTER — Ambulatory Visit: Payer: Medicare Other | Admitting: Physical Therapy

## 2017-05-01 DIAGNOSIS — G8929 Other chronic pain: Secondary | ICD-10-CM

## 2017-05-01 DIAGNOSIS — M6281 Muscle weakness (generalized): Secondary | ICD-10-CM | POA: Diagnosis not present

## 2017-05-01 DIAGNOSIS — R2689 Other abnormalities of gait and mobility: Secondary | ICD-10-CM

## 2017-05-01 DIAGNOSIS — M25662 Stiffness of left knee, not elsewhere classified: Secondary | ICD-10-CM | POA: Diagnosis not present

## 2017-05-01 DIAGNOSIS — M25562 Pain in left knee: Principal | ICD-10-CM

## 2017-05-01 NOTE — Therapy (Signed)
[place], [place], [address] Phone: [phone]   Fax:  [phone]  Physical Therapy Treatment  Patient Details  Name: Eugene [name] MRN: [id number] Date of Birth: 1961/06/02 Referring Provider: Paralee Cancel MD  Encounter Date: 05/01/2017      PT End of Session - 05/01/17 1251    Visit Number 3   Number of Visits 17   Date for PT Re-Evaluation 06/13/17   PT Start Time 4332   PT Stop Time 9518   PT Time Calculation (min) 53 min      Past Medical History:  Diagnosis Date  . Hypertension   . Seizures ([place])   . Stroke [facility])     Past Surgical History:  Procedure Laterality Date  . HERNIA REPAIR    . ORIF TIBIA PLATEAU Left 03/28/2017   Procedure: OPEN REDUCTION INTERNAL FIXATION (ORIF) TIBIAL PLATEAU;  Surgeon: Paralee Cancel, MD;  Location: [facility];  Service: Orthopedics;  Laterality: Left;    There were no vitals filed for this visit.      Subjective Assessment - 05/01/17 1249    Subjective My knee swells at night. Every once in awhile the knee throbbs.    Currently in Pain? No/denies            [facility] PT Assessment - 05/01/17 0001      AROM   Left Knee Extension -20   Left Knee Flexion 115     PROM   Left Knee Extension -10                     [facility] Adult PT Treatment/Exercise - 05/01/17 0001      Self-Care   Self-Care RICE   RICE Pt is not elevating above heart, sleeps in recliner, advised to elevate feet more to reduce night time swelling,      Knee/Hip Exercises: Stretches   Active Hamstring Stretch 3 reps;30 seconds   Active Hamstring Stretch Limitations supine with strap      Knee/Hip Exercises: Aerobic   Nustep L4 x 15 minutes cues for quad contraction,  concurrent with RICE, HEP, DME use      Knee/Hip Exercises: Standing   Knee Flexion 10 reps   Knee Flexion Limitations 2#   Hip Flexion 15 reps   Hip Flexion Limitations 2#   Hip Abduction 15 reps    Abduction Limitations 2#     Knee/Hip Exercises: Seated   Long Arc Quad 20 reps   Long Arc Quad Weight 2 lbs.   Other Seated Knee/Hip Exercises seated quad set x 10 with heel on floor      Knee/Hip Exercises: Supine   Quad Sets 1 set;10 reps;Left;Strengthening  with 5 sec hold   Quad Sets Limitations towel   Short Arc [employer] 20 reps   Short Arc Quad Sets Limitations 2#   Heel Slides 10 reps   Straight Leg Raises 10 reps;2 sets   Straight Leg Raises Limitations cues for initial quad set      Modalities   Modalities Cryotherapy     Cryotherapy   Number Minutes Cryotherapy 10 Minutes   Cryotherapy Location Knee   Type of Cryotherapy Ice pack     Manual Therapy   Manual therapy comments Patella mobs all planes                   PT Short Term Goals - 05/01/17 1301      PT SHORT TERM  GOAL #1   Title pt will be I with inital HEP (05/18/2017)   Time 4   Period Weeks   Status Achieved     PT SHORT TERM GOAL #2   Title pt will be able to verbalize / demo proper precuations and use of appropriate DME for safety and compliance ( 05/18/2017)   Baseline able to verbalize/does not demo   Time 4   Period Weeks   Status Partially Met     PT SHORT TERM GOAL #3   Title pt will demo techniques to prevent and reduce pain and edema/inflammation via RICE and HEP (05/18/2017)   Baseline moe education on elevation provided    Time 4   Period Weeks   Status Partially Met     PT SHORT TERM GOAL #4   Title he will increase L knee flexion / extension by >/= 5 degrees to promote functional progression (05/18/2017)   Baseline 10-115    Time 4   Period Weeks   Status Achieved           PT Long Term Goals - 04/18/17 1207      PT LONG TERM GOAL #1   Title pt will improve knee flexion to >/= 120 degrees and extension to </= -3 degrees with </= 2/10 pain to promote functional and efficient gait pattern (06/13/2017)   Time 8   Period Weeks   Status New     PT LONG TERM  GOAL #2   Title pt will increase hip/ knee strength in LLE to >/= 4/5 to promote hip/ knee stability and promote safety with walking/ standing (06/13/2017)   Time 8   Period Weeks   Status New     PT LONG TERM GOAL #3   Title pt will be able to walk/ standing with LRAD for >/= 30 min with </= 2/10 pain for functional endurance required for ADLs and work related activities (06/13/2017)   Time 8   Period Weeks   Status New     PT LONG TERM GOAL #4   Title increase FOTO score to </= 40% limited to demo improvement in function (06/13/2017)   Time 8   Period Weeks   Status New               Plan - 05/01/17 1303    Clinical Impression Statement Pt arrives reporting no pain but notes night time swelling. Re-educated on elevation above heart. Pt sleeps in recliner and plans to elevate feet more. He ambulates with RW however uses it to the side as a cane. He demonstrates improved knee extension PROM and improved knee flexion AROM. Continued -20 quad lag. He requires tactile cues to reproduce quad set. STG#2, #3 partially met, STG#4 met.    PT Next Visit Plan assess/ review HEP, what did MD say on 05/03/17?? knee mobs/ AROM, strengthening, modalities PRn   PT Home Exercise Plan seated heel/ toe raise, heel slides, quad set, SLR, SAQ, LAQ    Consulted and Agree with Plan of Care Patient      Patient will benefit from skilled therapeutic intervention in order to improve the following deficits and impairments:  Abnormal gait, Pain, Improper body mechanics, Postural dysfunction, Increased edema, Decreased strength, Decreased endurance, Decreased activity tolerance, Decreased balance, Decreased range of motion, Increased muscle spasms, Difficulty walking  Visit Diagnosis: Chronic pain of left knee  Stiffness of left knee, not elsewhere classified  Muscle weakness (generalized)  Other abnormalities of gait and mobility  Problem List Patient Active Problem List   Diagnosis Date Noted   . Overweight (BMI 25.0-29.9) 03/29/2017  . Tibial plateau fracture, left, closed, initial encounter 03/27/2017  . Current smoker 05/07/2014  . Partial seizure ([place]) 05/24/2013  . TIA (transient ischemic attack) 05/04/2013  . History of hemorrhagic stroke with residual hemiparesis ([place]) 05/04/2013  . Hypertension 05/04/2013  . History of drug abuse 05/04/2013  . Thrombocytopenia ([place]) 05/04/2013    Dorene Ar, PTA 05/01/2017, 1:38 PM  [place], [place], [address] Phone: [phone]   Fax:  [phone]  Name: Eugene [name] MRN: [id number] Date of Birth: 05/01/1961

## 2017-05-03 ENCOUNTER — Ambulatory Visit: Payer: Medicare Other | Admitting: Physical Therapy

## 2017-05-04 DIAGNOSIS — S82122D Displaced fracture of lateral condyle of left tibia, subsequent encounter for closed fracture with routine healing: Secondary | ICD-10-CM | POA: Diagnosis not present

## 2017-05-08 ENCOUNTER — Ambulatory Visit: Payer: Medicare Other | Attending: Orthopedic Surgery | Admitting: Physical Therapy

## 2017-05-08 DIAGNOSIS — G8929 Other chronic pain: Secondary | ICD-10-CM | POA: Diagnosis not present

## 2017-05-08 DIAGNOSIS — M6281 Muscle weakness (generalized): Secondary | ICD-10-CM | POA: Diagnosis not present

## 2017-05-08 DIAGNOSIS — M25662 Stiffness of left knee, not elsewhere classified: Secondary | ICD-10-CM | POA: Insufficient documentation

## 2017-05-08 DIAGNOSIS — M25562 Pain in left knee: Secondary | ICD-10-CM | POA: Insufficient documentation

## 2017-05-08 DIAGNOSIS — R2689 Other abnormalities of gait and mobility: Secondary | ICD-10-CM | POA: Insufficient documentation

## 2017-05-08 NOTE — Therapy (Signed)
[place], [place], [address] Phone: [phone]   Fax:  [phone]  Physical Therapy Treatment  Patient Details  Name: Eugene [name] MRN: [id number] Date of Birth: [birth year] Referring Provider: Paralee Cancel MD  Encounter Date: 05/08/2017      PT End of Session - 05/08/17 1234    Visit Number 4   Number of Visits 17   Date for PT Re-Evaluation 06/13/17   PT Start Time 1230  spent extra  uncharged time on Nustep    PT Stop Time 1333   PT Time Calculation (min) 63 min      Past Medical History:  Diagnosis Date  . Hypertension   . Seizures ([place])   . Stroke [facility])     Past Surgical History:  Procedure Laterality Date  . HERNIA REPAIR    . ORIF TIBIA PLATEAU Left 03/28/2017   Procedure: OPEN REDUCTION INTERNAL FIXATION (ORIF) TIBIAL PLATEAU;  Surgeon: Paralee Cancel, MD;  Location: [facility];  Service: Orthopedics;  Laterality: Left;    There were no vitals filed for this visit.      Subjective Assessment - 05/08/17 1238    Currently in Pain? No/denies            [facility] PT Assessment - 05/08/17 0001      AROM   Left Knee Extension -20   Left Knee Flexion 120                     [facility] Adult PT Treatment/Exercise - 05/08/17 0001      Knee/Hip Exercises: Stretches   Active Hamstring Stretch 3 reps;30 seconds   Active Hamstring Stretch Limitations supine with strap    Gastroc Stretch Limitations supine with strap     Knee/Hip Exercises: Aerobic   Nustep L5 x 20 minutes cues for quad contraction,  concurrent with RICE, HEP, DME use      Knee/Hip Exercises: Standing   Knee Flexion 10 reps;2 sets   Knee Flexion Limitations 3#   Hip Flexion 10 reps   Hip Flexion Limitations 3#   Side Lunges --   Hip Abduction 15 reps   Abduction Limitations 3#   Hip Extension 10 reps;2 sets   Extension Limitations 3#     Knee/Hip Exercises: Seated   Long Arc Quad 20 reps   Long Arc  Quad Weight 3 lbs.   Other Seated Knee/Hip Exercises seated quad set x 10 with heel on floor , the SLR x 10 with initiail quad set    Hamstring Curl 20 reps   Hamstring Limitations red     Knee/Hip Exercises: Supine   Quad Sets 10 reps;Left;Strengthening;2 sets  with 5 sec hold   Short Arc [employer] 20 reps   Short Arc Quad Sets Limitations 3#   Terminal Knee Extension 2 sets;10 reps   Terminal Knee Extension Limitations heel prop   Straight Leg Raises 10 reps;2 sets   Straight Leg Raises Limitations cues for initial quad set                   PT Short Term Goals - 05/01/17 1301      PT SHORT TERM GOAL #1   Title pt will be I with inital HEP (05/18/2017)   Time 4   Period Weeks   Status Achieved     PT SHORT TERM GOAL #2   Title pt will be able to verbalize / demo proper precuations and use  of appropriate DME for safety and compliance ( 05/18/2017)   Baseline able to verbalize/does not demo   Time 4   Period Weeks   Status Partially Met     PT SHORT TERM GOAL #3   Title pt will demo techniques to prevent and reduce pain and edema/inflammation via RICE and HEP (05/18/2017)   Baseline moe education on elevation provided    Time 4   Period Weeks   Status Partially Met     PT SHORT TERM GOAL #4   Title he will increase L knee flexion / extension by >/= 5 degrees to promote functional progression (05/18/2017)   Baseline 10-115    Time 4   Period Weeks   Status Achieved           PT Long Term Goals - 04/18/17 1207      PT LONG TERM GOAL #1   Title pt will improve knee flexion to >/= 120 degrees and extension to </= -3 degrees with </= 2/10 pain to promote functional and efficient gait pattern (06/13/2017)   Time 8   Period Weeks   Status New     PT LONG TERM GOAL #2   Title pt will increase hip/ knee strength in LLE to >/= 4/5 to promote hip/ knee stability and promote safety with walking/ standing (06/13/2017)   Time 8   Period Weeks   Status New     PT  LONG TERM GOAL #3   Title pt will be able to walk/ standing with LRAD for >/= 30 min with </= 2/10 pain for functional endurance required for ADLs and work related activities (06/13/2017)   Time 8   Period Weeks   Status New     PT LONG TERM GOAL #4   Title increase FOTO score to </= 40% limited to demo improvement in function (06/13/2017)   Time 8   Period Weeks   Status New               Plan - 05/08/17 1234    Clinical Impression Statement Pt saw MD and MD asked him to use RW for 2 more weeks. He is returning to work July 5th, xrays were good per patient report. He will sit while working. Continued open chain and non weight bearing exercises without increased pain.     PT Next Visit Plan assess/ review HEP, what did MD say on 05/03/17?? knee mobs/ AROM, strengthening, modalities PRn   PT Home Exercise Plan seated heel/ toe raise, heel slides, quad set, SLR, SAQ, LAQ    Consulted and Agree with Plan of Care Patient      Patient will benefit from skilled therapeutic intervention in order to improve the following deficits and impairments:  Abnormal gait, Pain, Improper body mechanics, Postural dysfunction, Increased edema, Decreased strength, Decreased endurance, Decreased activity tolerance, Decreased balance, Decreased range of motion, Increased muscle spasms, Difficulty walking  Visit Diagnosis: Chronic pain of left knee  Stiffness of left knee, not elsewhere classified  Muscle weakness (generalized)  Other abnormalities of gait and mobility     Problem List Patient Active Problem List   Diagnosis Date Noted  . Overweight (BMI 25.0-29.9) 03/29/2017  . Tibial plateau fracture, left, closed, initial encounter 03/27/2017  . Current smoker 05/07/2014  . 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 TIA (transient ischemic attack) 05/04/2013  . History of hemorrhagic stroke with residual hemiparesis ([place]) 05/04/2013  . Hypertension 05/04/2013  . History of drug abuse 05/04/2013   .  Thrombocytopenia ([place]) 05/04/2013    Dorene Ar, PTA 05/08/2017, 1:25 PM  [facility] [address] [place], [place], [address] Phone: [phone]   Fax:  [phone]  Name: Eugene [name] MRN: [id number] Date of Birth: [birth year]

## 2017-05-11 ENCOUNTER — Ambulatory Visit: Payer: Medicare Other | Admitting: Physical Therapy

## 2017-05-12 ENCOUNTER — Telehealth: Payer: Self-pay | Admitting: Diagnostic Neuroimaging

## 2017-05-12 NOTE — Telephone Encounter (Signed)
Patient called to cancel his one year follow up because he has a therapy apt at the same time that he really needs to attend. I offered him the next available follow up but he is concerned about waiting that long. Please call and advise.

## 2017-05-15 ENCOUNTER — Ambulatory Visit: Payer: Medicare Other | Admitting: Physical Therapy

## 2017-05-15 DIAGNOSIS — M25662 Stiffness of left knee, not elsewhere classified: Secondary | ICD-10-CM | POA: Diagnosis not present

## 2017-05-15 DIAGNOSIS — M6281 Muscle weakness (generalized): Secondary | ICD-10-CM | POA: Diagnosis not present

## 2017-05-15 DIAGNOSIS — G8929 Other chronic pain: Secondary | ICD-10-CM | POA: Diagnosis not present

## 2017-05-15 DIAGNOSIS — M25562 Pain in left knee: Principal | ICD-10-CM

## 2017-05-15 DIAGNOSIS — R2689 Other abnormalities of gait and mobility: Secondary | ICD-10-CM | POA: Diagnosis not present

## 2017-05-15 NOTE — Therapy (Addendum)
[place], [place], [address] Phone: [phone]   Fax:  [phone]  Physical Therapy Treatment / Discharge Summary  Patient Details  Name: Eugene [name] MRN: [id number] Date of Birth: 08/13/1961 Referring Provider: Paralee Cancel MD  Encounter Date: 05/15/2017      PT End of Session - 05/15/17 1313    Visit Number 5   Number of Visits 17   Date for PT Re-Evaluation 06/13/17   PT Start Time 0109   PT Stop Time 0202   PT Time Calculation (min) 53 min   Activity Tolerance Patient tolerated treatment well   Behavior During Therapy [facility] for tasks assessed/performed      Past Medical History:  Diagnosis Date  . Hypertension   . Seizures ([place])   . Stroke [facility])     Past Surgical History:  Procedure Laterality Date  . HERNIA REPAIR    . ORIF TIBIA PLATEAU Left 03/28/2017   Procedure: OPEN REDUCTION INTERNAL FIXATION (ORIF) TIBIAL PLATEAU;  Surgeon: Paralee Cancel, MD;  [facility];  Service: Orthopedics;  Laterality: Left;    There were no vitals filed for this visit.      Subjective Assessment - 05/15/17 1312    Subjective I am doing fine at work. It makes me stiff to have to sit.    Currently in Pain? No/denies            [facility] PT Assessment - 05/15/17 0001      AROM   Left Knee Extension -20   Left Knee Flexion 120     PROM   Left Knee Extension -10                     [facility] Adult PT Treatment/Exercise - 05/15/17 0001      Knee/Hip Exercises: Aerobic   Nustep L5 x 15 minutes cues for quad contraction,  concurrent with RICE, HEP, DME use      Knee/Hip Exercises: Standing   Knee Flexion 20 reps   Knee Flexion Limitations 3#   Hip Flexion 20 reps   Hip Flexion Limitations 3#   Hip Abduction 20 reps   Abduction Limitations 3#   Hip Extension 20 reps   Extension Limitations 3#      Knee/Hip Exercises: Seated   Long Arc Quad 20 reps;2 sets   Long Arc Quad  Weight 3 lbs.   Hamstring Curl 20 reps   Hamstring Limitations red     Knee/Hip Exercises: Supine   Quad Sets 10 reps;Left;Strengthening;2 sets  with 5 sec hold   Short Arc [employer] 20 reps   Short Arc Quad Sets Limitations 3#    Terminal Knee Extension 2 sets;10 reps   Terminal Knee Extension Limitations heel prop   Straight Leg Raises 10 reps;2 sets   Straight Leg Raises Limitations cues for initial quad set      Cryotherapy   Number Minutes Cryotherapy 10 Minutes   Cryotherapy Location Knee   Type of Cryotherapy Ice pack                  PT Short Term Goals - 05/15/17 1316      PT SHORT TERM GOAL #1   Title pt will be I with inital HEP (05/18/2017)   Status Achieved     PT SHORT TERM GOAL #2   Title pt will be able to verbalize / demo proper precuations and use of appropriate DME for safety and  compliance ( 05/18/2017)   Baseline needs to use RW until next MD visit, currently using it some    Time 4   Period Weeks   Status Partially Met     PT SHORT TERM GOAL #3   Title pt will demo techniques to prevent and reduce pain and edema/inflammation via RICE and HEP (05/18/2017)   Baseline more education on elevation provided    Time 4   Period Weeks   Status Partially Met     PT SHORT TERM GOAL #4   Title he will increase L knee flexion / extension by >/= 5 degrees to promote functional progression (05/18/2017)   Time 4   Period Weeks   Status Achieved           PT Long Term Goals - 04/18/17 1207      PT LONG TERM GOAL #1   Title pt will improve knee flexion to >/= 120 degrees and extension to </= -3 degrees with </= 2/10 pain to promote functional and efficient gait pattern (06/13/2017)   Time 8   Period Weeks   Status New     PT LONG TERM GOAL #2   Title pt will increase hip/ knee strength in LLE to >/= 4/5 to promote hip/ knee stability and promote safety with walking/ standing (06/13/2017)   Time 8   Period Weeks   Status New     PT LONG TERM GOAL  #3   Title pt will be able to walk/ standing with LRAD for >/= 30 min with </= 2/10 pain for functional endurance required for ADLs and work related activities (06/13/2017)   Time 8   Period Weeks   Status New     PT LONG TERM GOAL #4   Title increase FOTO score to </= 40% limited to demo improvement in function (06/13/2017)   Time 8   Period Weeks   Status New        G-Code:  Functional assessment: clinical judgement Functional limitation: mobility: walking and moving around Goals status CJ Discharged status CK        Plan - 05/15/17 1351    Clinical Impression Statement Pt sees MD before his next appointment for another follow-up. He is working while sitting and it is making him stiff. He has a -20 quad lag. Eduation provided on fall risk and likely need for more PT after MD appt.    PT Next Visit Plan assess/ review HEP, what did MD say on 05/17/2017? schedule more appts? knee mobs/ AROM, strengthening, modalities PRn   PT Home Exercise Plan seated heel/ toe raise, heel slides, quad set, SLR, SAQ, LAQ    Consulted and Agree with Plan of Care Patient      Patient will benefit from skilled therapeutic intervention in order to improve the following deficits and impairments:  Abnormal gait, Pain, Improper body mechanics, Postural dysfunction, Increased edema, Decreased strength, Decreased endurance, Decreased activity tolerance, Decreased balance, Decreased range of motion, Increased muscle spasms, Difficulty walking  Visit Diagnosis: Chronic pain of left knee  Stiffness of left knee, not elsewhere classified  Muscle weakness (generalized)  Other abnormalities of gait and mobility     Problem List Patient Active Problem List   Diagnosis Date Noted  . Overweight (BMI 25.0-29.9) 03/29/2017  . Tibial plateau fracture, left, closed, initial encounter 03/27/2017  . Current smoker 05/07/2014  . Partial seizure ([facility]) 05/24/2013  . TIA (transient ischemic attack) 05/04/2013   . History of hemorrhagic stroke with  residual hemiparesis ([place]) 05/04/2013  . Hypertension 05/04/2013  . History of drug abuse 05/04/2013  . Thrombocytopenia ([place]) 05/04/2013    Dorene Ar , PTA 05/15/2017, 2:08 PM  [facility] [address] [place], [place], [address] Phone: [phone]   Fax:  [phone]  Name: Hamed Debella MRN: [id number] Date of Birth: 1961-02-26         PHYSICAL THERAPY DISCHARGE SUMMARY  Visits from Start of Care: 5  Current functional level related to goals / functional outcomes: See goals   Remaining deficits: unknown   Education / Equipment: HEP, precautions, posture and biomechanics, use of DME  Plan: Patient agrees to discharge.  Patient goals were not met. Patient is being discharged due to not returning since the last visit.  ?????     Kristoffer Leamon PT, DPT, LAT, ATC  06/06/17  11:18 AM

## 2017-05-15 NOTE — Telephone Encounter (Signed)
Spoke to pt and made appt for Friday 05-26-17 at 1000.  He has enough medication to get until that appt.

## 2017-05-16 ENCOUNTER — Ambulatory Visit: Payer: Medicare Other | Admitting: Diagnostic Neuroimaging

## 2017-05-17 ENCOUNTER — Ambulatory Visit: Payer: Medicare Other | Admitting: Physical Therapy

## 2017-05-26 ENCOUNTER — Ambulatory Visit: Payer: Self-pay | Admitting: Diagnostic Neuroimaging

## 2017-05-29 ENCOUNTER — Encounter: Payer: Self-pay | Admitting: Diagnostic Neuroimaging

## 2017-06-17 ENCOUNTER — Other Ambulatory Visit: Payer: Self-pay | Admitting: Diagnostic Neuroimaging

## 2017-06-20 ENCOUNTER — Ambulatory Visit (INDEPENDENT_AMBULATORY_CARE_PROVIDER_SITE_OTHER): Payer: Medicare Other | Admitting: Diagnostic Neuroimaging

## 2017-06-20 ENCOUNTER — Encounter: Payer: Self-pay | Admitting: Diagnostic Neuroimaging

## 2017-06-20 ENCOUNTER — Telehealth: Payer: Self-pay | Admitting: *Deleted

## 2017-06-20 ENCOUNTER — Encounter (INDEPENDENT_AMBULATORY_CARE_PROVIDER_SITE_OTHER): Payer: Self-pay

## 2017-06-20 VITALS — BP 132/82 | HR 65 | Resp 16 | Ht 67.0 in | Wt 163.8 lb

## 2017-06-20 DIAGNOSIS — R569 Unspecified convulsions: Secondary | ICD-10-CM | POA: Diagnosis not present

## 2017-06-20 DIAGNOSIS — I69359 Hemiplegia and hemiparesis following cerebral infarction affecting unspecified side: Secondary | ICD-10-CM | POA: Diagnosis not present

## 2017-06-20 MED ORDER — LEVETIRACETAM 1000 MG PO TABS: 1000.0000 mg | ORAL_TABLET | Freq: Two times a day (BID) | ORAL | 4 refills | Status: DC

## 2017-06-20 NOTE — Progress Notes (Signed)
PATIENT: Eugene MinersStepfon Dosh DOB: 04-22-1961  REASON FOR VISIT: routine follow up for partial seizures, hx of stroke HISTORY FROM: patient  Chief Complaint  Patient presents with  . Seizures    Sts. he is compliant with Keppra. Denies sz. activity since last ov. Denies new stroke sx. Since last ov, he fell at home (wet floor) and suffered fx. left lower extremity, has had ortho surgery.  He is wearing a right AFO/fim  . History of CVA         HISTORY OF PRESENT ILLNESS:  UPDATE 06/20/17: Since last visit, no seizures. Tolerating LEV 1000mg  twice a day. Also had a trip and fall in May 2018, thel left tibial plate fracture, s/p surgery. Now recovered from left knee surgery.   UPDATE 05/16/16: Since last visit, doing well. No sz. Tolerating LEV. Some right arm weakness when overheated. Some balance diff when not wearing brace in summertime (too hot to wear).   UPDATE 12/04/15: Since last visit, doing well. No new events. No sz. Tolerating meds.  UPDATE 09/03/15 (VRP): Since last visit, doing well. No seizures. Still drinking a little beer and smoking some cigarettes. Apparently, PCP checked labs 2 weeks ago, and due to some abnl, his LEV was reduced to 1000mg  BID and amantadine was added. Patient not sure why.   UPDATE 05/13/15 (VRP): Since last visit, doing well, no seizures. No new events. Tolerating meds.   UPDATE 11/10/14 (VRP): Since last visit, no seizures. Doing water aerobics for past 3 months. Slipped off some stairs last month, accidentally. No major injury fortunately.  UPDATE 05/07/14 (LL):  Since last visit, doing well, no seizures or stroke symptoms.  Having increased difficulty lately lifting right leg.  Seems to be dragging it more. He is tolerating Keppra well, taking 1500 mg bid. Labs done recently at Dr. Mathews RobinsonsShelton's office were reportedly good. Blood pressure is well controlled, it is 114/73 in office today. He started back smoking cigarettes again, smoking less than half pack a day,  knows he needs to quit.  UPDATE 11/11/13 LL: Patient returns for follow up and states he has been doing well, no convulsions since last visit. He reports that he has a tingling sensation in the right parietal area of his head sometimes which he states feels like when he used to smoke crack (which he hasn't done in many years), but no seizure follows. He is tolerating Keppra well, taking 1500 mg bid.   UPDATE 05/24/13: Since last visit, patient had one visit to emergency room for possible seizure versus stroke. Currently patient has been having dental problems, 2 extractions and was placed on antibiotics and pain medication. One week later, patient was on the bus and had fairly sudden onset feeling as though he might have a partial seizure. Previous partial seizures involve convulsions on the right arm and right leg. Patient sat on the bus but did not go into convulsions. When he stood up he felt weakness in his right leg. Patient went to the hospital for further evaluation. He was admitted under TIA protocol. MRI of the brain was done which showed no acute findings. Patient was discharged on Plavix instead of aspirin. Since that time patient has done well. No further events. He does feel dizziness and has been seeing dark spots, since starting Plavix. He has not missed a seizure medications.   UPDATE 01/14/13: Doing well. Had 1 breakthrogh sz in Aug 2013, when he missed meds. Otherwise doing well.   UPDATE 02/01/12: Had another  breakthrough sz (right arm/leg convulsions) in Feb 2013. Had arguement with daughter that evening by phone. Then had sz at 4am, when he woke up. No missed doses. No other factors.   PRIOR HPI: 56 year old right-handed male with history of intracerebral hemorrhage, here for evaluation of syncope versus seizure. The patient was admitted to [facility] in January 2012 for left frontal intracerebral hemorrhage with right upper and lower extremity weakness. He was discharged to  rehabilitation in [facility]. He was doing well with improvement of right-sided weakness. On June 20, 2011, he was in the bathroom, shaving, when he developed lightheadedness, called to his sister for help, and fell to the ground. When she came to the bathroom she found him shaking for approximately one to 2 minutes. He was profusely sweating. No tongue biting or incontinence. After the episode, patient had significant right upper and lower extremity weakness. He was somewhat confused for several hours after the event. He went to the emergency room, had CT scan of the head and was discharged with a diagnosis of syncope. Since that time he has had no further events.    REVIEW OF SYSTEMS: Full 14 system review of systems performed and negative except: leg swelling walking dif.     ALLERGIES: No Known Allergies  HOME MEDICATIONS: Outpatient Encounter Prescriptions as of 06/20/2017  Medication Sig  . levETIRAcetam (KEPPRA) 1000 MG tablet Take 1 tablet (1,000 mg total) by mouth 2 (two) times daily.  Marland Kitchen losartan-hydrochlorothiazide (HYZAAR) 100-25 MG per tablet Take 1 tablet by mouth every morning.   . Multiple Vitamin (MULTIVITAMIN WITH MINERALS) TABS Take 1 tablet by mouth every morning.  . pravastatin (PRAVACHOL) 20 MG tablet Take 20 mg by mouth every morning.  . verapamil (CALAN) 80 MG tablet Take 80 mg by mouth 3 (three) times daily.    . [DISCONTINUED] docusate sodium (COLACE) 100 MG capsule Take 1 capsule (100 mg total) by mouth 2 (two) times daily.  . [DISCONTINUED] ferrous sulfate 325 (65 FE) MG tablet Take 1 tablet (325 mg total) by mouth 3 (three) times daily after meals.  . [DISCONTINUED] HYDROcodone-acetaminophen (NORCO) 7.5-325 MG tablet 7.5-325 po q4h prn, 5/24, 5/25, 5/26, 5/27; 5/28 q6h prn, 5/29, 5/30, 5/31 q8h prn.  . [DISCONTINUED] methocarbamol (ROBAXIN) 500 MG tablet Take 1 tablet (500 mg total) by mouth every 6 (six) hours as needed for muscle spasms.  .  [DISCONTINUED] polyethylene glycol (MIRALAX / GLYCOLAX) packet Take 17 g by mouth 2 (two) times daily.   No facility-administered encounter medications on file as of 06/20/2017.      PHYSICAL EXAM Vitals:   06/20/17 1339  BP: 132/82  Pulse: 65  Resp: 16  Weight: 163 lb 12.8 oz (74.3 kg)  Height: 5\' 7"  (1.702 m)   Body mass index is 25.65 kg/m. No exam data present   GENERAL EXAM/CONSTITUTIONAL: Vitals:  Vitals:   06/20/17 1339  BP: 132/82  Pulse: 65  Resp: 16  Weight: 163 lb 12.8 oz (74.3 kg)  Height: 5\' 7"  (1.702 m)     Patient is in no distress; well developed, nourished and groomed; neck is supple  CARDIOVASCULAR:  Examination of carotid arteries is normal; no carotid bruits  Regular rate and rhythm, no murmurs  Examination of peripheral vascular system by observation and palpation is normal  EYES:  Ophthalmoscopic exam of optic discs and posterior segments is normal; no papilledema or hemorrhages  MUSCULOSKELETAL:  Gait, strength, tone, movements noted in Neurologic exam below  NEUROLOGIC: MENTAL STATUS:   awake, alert, oriented to person, place and time  recent and remote memory intact  normal attention and concentration  language fluent, comprehension intact, naming intact,   fund of knowledge appropriate  CRANIAL NERVE:   2nd - no papilledema on fundoscopic exam  2nd, 3rd, 4th, 6th - pupils equal and reactive to light, visual fields full to confrontation, extraocular muscles intact, no nystagmus  5th - facial sensation symmetric  7th - facial strength symmetric  8th - hearing intact  9th - palate elevates symmetrically, uvula midline  11th - shoulder shrug symmetric  12th - tongue protrusion midline  MOTOR:   normal bulk and tone  RUE 5 PROX; 3 DISTAL  LUE 5  RLE PROX 5; DISTAL 2-3; RIGHT AFO  LLE 5  SENSORY:   normal and symmetric to light touch  COORDINATION:   finger-nose-finger, fine finger movements --> SLOW  ON RIGHT normal  REFLEXES:   deep tendon reflexes TRACE and symmetric  GAIT/STATION:   RIGHT HEMIPARETIC GAIT; RIGHT AFO     DIAGNOSTIC DATA (LABS, IMAGING, TESTING)  05/04/13 MRI brain (without) - No acute finding. 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 Background pattern of moderate chronic small vessel disease throughout the cerebral hemispheric white matter.   05/05/13 MRA head - normal   05/06/13 carotid u/s - Bilateral: intimal wall thickening CCA. Mild soft plaque origin ICA. 0-39% ICA stenosis. vertebral artery flow is antegrade. ICA/CCA ratio: R-1.39 L-1.0   05/05/13 TTE - No prior study for comparison. Mild basal septal hypertrophy with LVEF 55-60%, mild diastolic dysfunction. No clear evidence of ASD or PFO.    ASSESSMENT: 56 y.o. 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 Now with episode of loss of consciousness, convulsions, postictal right-sided weakness, postictal confusion on June 20, 2011. Another partial sz in Feb 2013 and Aug 2013. Event in June 2014 may have been partial seizure, seizure aura or TIA. No further episodes since that time.     Dx:  No diagnosis found.    PLAN:   I spent 15 minutes of face to face time with patient. Greater than 50% of time was spent in counseling and coordination of care with patient. In summary we discussed:   - continue levetiracetam 1000mg  twice a day - continue aspirin 81mg  - safety and seizure precautions reviewed   Meds ordered this encounter  Medications  . levETIRAcetam (KEPPRA) 1000 MG tablet    Sig: Take 1 tablet (1,000 mg total) by mouth 2 (two) times daily.    Dispense:  180 tablet    Refill:  4   Return in about 1 year (around 06/20/2018) for with NP.     Suanne Marker, MD 06/20/2017, 1:57 PM Certified in Neurology, Neurophysiology and Neuroimaging  [facility] [address], [address] [place], [place] [address] [phone]

## 2017-06-20 NOTE — Telephone Encounter (Signed)
Received escribed request to r/f Keppra.  Pt. last seen in our office 05/16/16.  He cancelled his 05/16/17 appt.  Noshowed 05/26/17 appt.  I lmom (identified vm) that VP has had a cancellation at 2pm this afternoon.  If he would like to come in then, Keppra can be refilled./fim

## 2017-06-28 ENCOUNTER — Emergency Department (HOSPITAL_COMMUNITY)
Admission: EM | Admit: 2017-06-28 | Discharge: 2017-06-29 | Disposition: A | Discharge status: Home / Self Care | Payer: Medicare Other | Attending: Physician Assistant | Admitting: Physician Assistant

## 2017-06-28 ENCOUNTER — Encounter (HOSPITAL_COMMUNITY): Payer: Self-pay | Admitting: Emergency Medicine

## 2017-06-28 DIAGNOSIS — Z7982 Long term (current) use of aspirin: Secondary | ICD-10-CM | POA: Diagnosis not present

## 2017-06-28 DIAGNOSIS — Z8673 Personal history of transient ischemic attack (TIA), and cerebral infarction without residual deficits: Secondary | ICD-10-CM | POA: Diagnosis not present

## 2017-06-28 DIAGNOSIS — Z79899 Other long term (current) drug therapy: Secondary | ICD-10-CM | POA: Diagnosis not present

## 2017-06-28 DIAGNOSIS — E860 Dehydration: Secondary | ICD-10-CM | POA: Insufficient documentation

## 2017-06-28 DIAGNOSIS — F17210 Nicotine dependence, cigarettes, uncomplicated: Secondary | ICD-10-CM | POA: Diagnosis not present

## 2017-06-28 DIAGNOSIS — I10 Essential (primary) hypertension: Secondary | ICD-10-CM | POA: Insufficient documentation

## 2017-06-28 DIAGNOSIS — R404 Transient alteration of awareness: Secondary | ICD-10-CM | POA: Diagnosis not present

## 2017-06-28 DIAGNOSIS — R55 Syncope and collapse: Secondary | ICD-10-CM | POA: Diagnosis not present

## 2017-06-28 LAB — I-STAT CHEM 8, ED
BUN: 23 mg/dL — AB (ref 6–20)
CALCIUM ION: 0.92 mmol/L — AB (ref 1.15–1.40)
CHLORIDE: 111 mmol/L (ref 101–111)
CREATININE: 1.4 mg/dL — AB (ref 0.61–1.24)
GLUCOSE: 99 mg/dL (ref 65–99)
HCT: 39 % (ref 39.0–52.0)
Hemoglobin: 13.3 g/dL (ref 13.0–17.0)
Potassium: 5.8 mmol/L — ABNORMAL HIGH (ref 3.5–5.1)
SODIUM: 140 mmol/L (ref 135–145)
TCO2: 22 mmol/L (ref 0–100)

## 2017-06-28 LAB — CBC WITH DIFFERENTIAL/PLATELET
BASOS PCT: 0 %
Basophils Absolute: 0 10*3/uL (ref 0.0–0.1)
Eosinophils Absolute: 0.1 10*3/uL (ref 0.0–0.7)
Eosinophils Relative: 1 %
HEMATOCRIT: 36.5 % — AB (ref 39.0–52.0)
HEMOGLOBIN: 13.2 g/dL (ref 13.0–17.0)
LYMPHS ABS: 1.9 10*3/uL (ref 0.7–4.0)
Lymphocytes Relative: 14 %
MCH: 29 pg (ref 26.0–34.0)
MCHC: 36.2 g/dL — AB (ref 30.0–36.0)
MCV: 80.2 fL (ref 78.0–100.0)
MONO ABS: 0.6 10*3/uL (ref 0.1–1.0)
MONOS PCT: 5 %
NEUTROS ABS: 10.5 10*3/uL — AB (ref 1.7–7.7)
Neutrophils Relative %: 80 %
Platelets: 80 10*3/uL — ABNORMAL LOW (ref 150–400)
RBC: 4.55 MIL/uL (ref 4.22–5.81)
RDW: 20.6 % — AB (ref 11.5–15.5)
WBC: 13.2 10*3/uL — ABNORMAL HIGH (ref 4.0–10.5)

## 2017-06-28 LAB — I-STAT TROPONIN, ED: Troponin i, poc: 0.05 ng/mL (ref 0.00–0.08)

## 2017-06-28 MED ORDER — SODIUM CHLORIDE 0.9 % IV BOLUS (SEPSIS)
1000.0000 mL | Freq: Once | INTRAVENOUS | Status: AC
  Administered 2017-06-28: 1000 mL via INTRAVENOUS

## 2017-06-28 MED ORDER — SODIUM CHLORIDE 0.9 % IV BOLUS (SEPSIS)
1000.0000 mL | Freq: Once | INTRAVENOUS | Status: AC
Start: 2017-06-28 — End: 2017-06-28
  Administered 2017-06-28: 1000 mL via INTRAVENOUS

## 2017-06-28 NOTE — Discharge Instructions (Signed)
You need to have a CHEMISTRY lab drawn tomorrow to look for your POTASSIUM and CREATINE.  Please return with the ED with any symptoms, or if you choose to get your blood work  done as we offered.   You are welcome back at any time.

## 2017-06-28 NOTE — ED Provider Notes (Signed)
[facility]-EM[facility]ENCY DEPT Provider Note   CSN: [id number] Arrival date & time: 06/28/17  2007     History   Chief Complaint Chief Complaint  Patient presents with  . Near Syncope    HPI Eugene [name] is a 56 y.o. male.  HPI   Patient's 56 year old male presenting with presyncope. Patient was working at the recall seems evening, outside. He reports he did not drink a lot of fluids during the day. He was checking tickets, gets all of a sudden he started feeling dizzy. He felt lightheaded dizzy and nauseous. He sat down and felt much better.  EMS arrived. They felt like he was having a STEMI on EKG. He was called a code STEMI. On arrival to ED patient was resting comfortably, never having chest pain today. EKG looks similar to prior. Code STEMI was canceled.  Past Medical History:  Diagnosis Date  . Hypertension   . Seizures (HCC)   . Stroke [facility])     Patient Active Problem List   Diagnosis Date Noted  . Overweight (BMI 25.0-29.9) 03/29/2017  . Tibial plateau fracture, left, closed, initial encounter 03/27/2017  . Current smoker 05/07/2014  . Partial seizure (HCC) 05/24/2013  . TIA (transient ischemic attack) 05/04/2013  . History of hemorrhagic stroke with residual hemiparesis (HCC) 05/04/2013  . Hypertension 05/04/2013  . History of drug abuse 05/04/2013  . Thrombocytopenia (HCC) 05/04/2013    Past Surgical History:  Procedure Laterality Date  . HERNIA REPAIR    . ORIF TIBIA PLATEAU Left 03/28/2017   Procedure: OPEN REDUCTION INTERNAL FIXATION (ORIF) TIBIAL PLATEAU;  Surgeon: Durene Romans, MD;  Location: [facility];  Service: Orthopedics;  Laterality: Left;       Home Medications    Prior to Admission medications   Medication Sig Start Date End Date Taking? Authorizing Provider  aspirin EC 81 MG tablet Take 81 mg by mouth daily.   Yes [provider]  levETIRAcetam (KEPPRA) 1000 MG tablet Take 1 tablet (1,000 mg total) by mouth 2 (two) times daily. 06/20/17   Yes Penumalli, Glenford Bayley, MD  losartan-hydrochlorothiazide (HYZAAR) 100-25 MG per tablet Take 1 tablet by mouth every morning.  10/08/13  Yes [provider]  Multiple Vitamin (MULTIVITAMIN WITH MINERALS) TABS Take 1 tablet by mouth every morning.   Yes [provider]  pravastatin (PRAVACHOL) 20 MG tablet Take 20 mg by mouth every morning.   Yes [provider]  verapamil (CALAN) 80 MG tablet Take 80 mg by mouth 3 (three) times daily.     Yes [provider]    Family History Family History  Problem Relation Age of Onset  . Asthma Mother   . Diabetes Mother   . Other Father        natural causes  . Stroke Paternal Grandmother   . Stroke Paternal Grandfather   . Heart disease Neg Hx     Social History Social History  Substance Use Topics  . Smoking status: Current Every Day Smoker    Years: 0.00    Types: Cigarettes  . Smokeless tobacco: Never Used     Comment: states he smokes maybe 2-3 cigarettes a day  . Alcohol use Yes     Allergies   Patient has no known allergies.   Review of Systems Review of Systems  Constitutional: Negative for activity change.  Respiratory: Negative for shortness of breath.   Cardiovascular: Negative for chest pain.  Gastrointestinal: Negative for abdominal pain.     Physical Exam Updated  Vital Signs BP 114/69   Pulse 81   Temp (!) 97.3 F (36.3 C)   Resp 18   Ht 5\' 7"  (1.702 m)   Wt 76.2 kg (168 lb)   SpO2 100%   BMI 26.31 kg/m   Physical Exam  Constitutional: He is oriented to person, place, and time. He appears well-nourished.  HENT:  Head: Normocephalic.  Eyes: Conjunctivae are normal.  Cardiovascular: Normal rate and regular rhythm.   No murmur heard. Pulmonary/Chest: Effort normal and breath sounds normal. No respiratory distress. He has no wheezes.  Abdominal: Soft. He exhibits no distension.  Neurological: He is oriented to person, place, and time.  Skin: Skin is warm and dry. He is  not diaphoretic.  Psychiatric: He has a normal mood and affect. His behavior is normal.     ED Treatments / Results  Labs (all labs ordered are listed, but only abnormal results are displayed) Labs Reviewed  CBC WITH DIFFERENTIAL/PLATELET - Abnormal; Notable for the following:       Result Value   WBC 13.2 (*)    HCT 36.5 (*)    MCHC 36.2 (*)    RDW 20.6 (*)    Platelets 80 (*)    Neutro Abs 10.5 (*)    All other components within normal limits  I-STAT CHEM 8, ED - Abnormal; Notable for the following:    Potassium 5.8 (*)    BUN 23 (*)    Creatinine, Ser 1.40 (*)    Calcium, Ion 0.92 (*)    All other components within normal limits  CBC WITH DIFFERENTIAL/PLATELET  CBC WITH DIFFERENTIAL/PLATELET  I-STAT TROPONIN, ED  I-STAT CHEM 8, ED    EKG  EKG Interpretation  Date/Time:  Wednesday June 28 2017 20:08:27 EDT Ventricular Rate:  75 PR Interval:    QRS Duration: 96 QT Interval:  397 QTC Calculation: 444 R Axis:   56 Text Interpretation:  Sinus rhythm ST elevation suggests acute pericarditis No significant change since last tracing Confirmed by Corlis Leak, Lindzey Zent ([id number]) on 06/28/2017 9:03:54 PM       Radiology No results found.  Procedures Procedures (including critical care time)  Medications Ordered in ED Medications  sodium chloride 0.9 % bolus 1,000 mL (0 mLs Intravenous Stopped 06/28/17 2120)  sodium chloride 0.9 % bolus 1,000 mL (0 mLs Intravenous Stopped 06/28/17 2312)     Initial Impression / Assessment and Plan / ED Course  I have reviewed the triage vital signs and the nursing notes.  Pertinent labs & imaging results that were available during my care of the patient were reviewed by me and considered in my medical decision making (see chart for details).    Patient is a 56 year old male presenting with near-syncope. Patient had classic symptoms for near-syncope including a reason, dehydration in the setting of heat. We'll give fluids, to baseline  lab work. We'll plan to have him discharge and follow-up with primary care as needed.  Patient was found to have elevated creatinine with elevated potassium. 2 L of fluids ordered. After these were given we wanted to  recheck his lab work. Patient refused repeat recheck lab work. Patient's sister was in the room. I discussed the possibilities of disability or death given high potassium. Patient still would like to go home. I told him that he needs a follow-up as soon as possible and repeat labs drawn. He says he will go in the morning.      Final Clinical Impressions(s) / ED Diagnoses  Final diagnoses:  None    New Prescriptions New Prescriptions   No medications on file     Abelino Derrick, MD 06/28/17 2345

## 2017-06-28 NOTE — ED Notes (Signed)
Patient refused Istat 8 ordered by EDP , EDP notified .

## 2017-06-28 NOTE — ED Triage Notes (Signed)
Patient reports brief dizziness/near syncopal episode at work this evening , initially called by EMS as a code STEMI but was cancelled by cardiologist ( Dr. Herbie Baltimore) at arrival , he received 324 mg ASA and NTG sl 1.4 mg by EMS prior to arrival . Pt. denies chest pain or SOB . Alert and oriented at arrival .

## 2017-08-29 LAB — GLUCOSE, POCT (MANUAL RESULT ENTRY): POC Glucose: 115 mg/dl — AB (ref 70–99)

## 2017-10-02 DIAGNOSIS — Z79899 Other long term (current) drug therapy: Secondary | ICD-10-CM | POA: Diagnosis not present

## 2017-10-02 DIAGNOSIS — E782 Mixed hyperlipidemia: Secondary | ICD-10-CM | POA: Diagnosis not present

## 2017-10-02 DIAGNOSIS — I10 Essential (primary) hypertension: Secondary | ICD-10-CM | POA: Diagnosis not present

## 2017-10-27 DIAGNOSIS — Z720 Tobacco use: Secondary | ICD-10-CM | POA: Diagnosis not present

## 2017-10-27 DIAGNOSIS — I10 Essential (primary) hypertension: Secondary | ICD-10-CM | POA: Diagnosis not present

## 2017-10-27 DIAGNOSIS — Z7982 Long term (current) use of aspirin: Secondary | ICD-10-CM | POA: Diagnosis not present

## 2018-03-26 DIAGNOSIS — R2681 Unsteadiness on feet: Secondary | ICD-10-CM | POA: Diagnosis not present

## 2018-03-26 DIAGNOSIS — E785 Hyperlipidemia, unspecified: Secondary | ICD-10-CM | POA: Diagnosis not present

## 2018-03-26 DIAGNOSIS — R569 Unspecified convulsions: Secondary | ICD-10-CM | POA: Diagnosis not present

## 2018-03-26 DIAGNOSIS — I10 Essential (primary) hypertension: Secondary | ICD-10-CM | POA: Diagnosis not present

## 2018-03-26 DIAGNOSIS — Z833 Family history of diabetes mellitus: Secondary | ICD-10-CM | POA: Diagnosis not present

## 2018-05-10 IMAGING — CT CT KNEE*L* W/O CM
3 of 4 series · 15 of 33 positions shown, 17 images · non-contrast
Comparison: Radiographs dated 03/27/2017

CLINICAL DATA: Closed fracture of the lateral tibial plateau.

EXAM:
CT OF THE LEFT KNEE WITHOUT CONTRAST
TECHNIQUE: Multidetector CT imaging of the left knee was performed according to
the standard protocol. Multiplanar CT image reconstructions were
also generated.

[Series 6: axial st · axial · 0.38mm/px · z∈[+711,+876]mm · 7 of 132 slices shown, 9 images]
[im 11/132  soft-tissue]
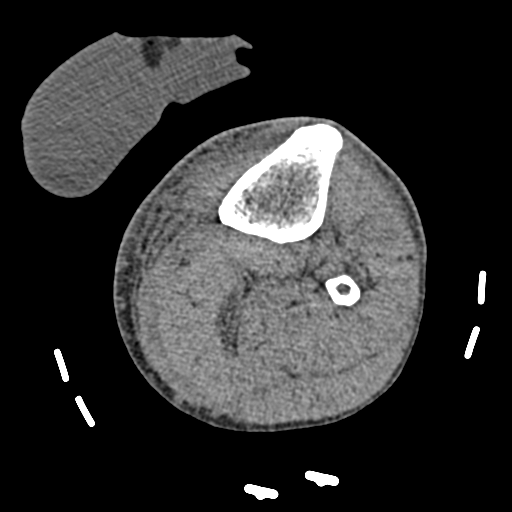
[im 11/132  bone]
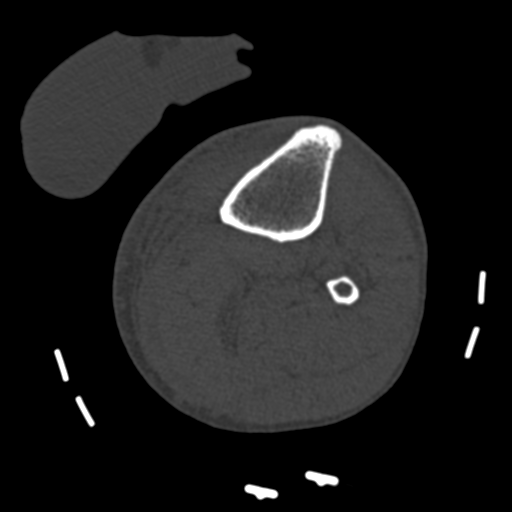
[im 31/132  bone]
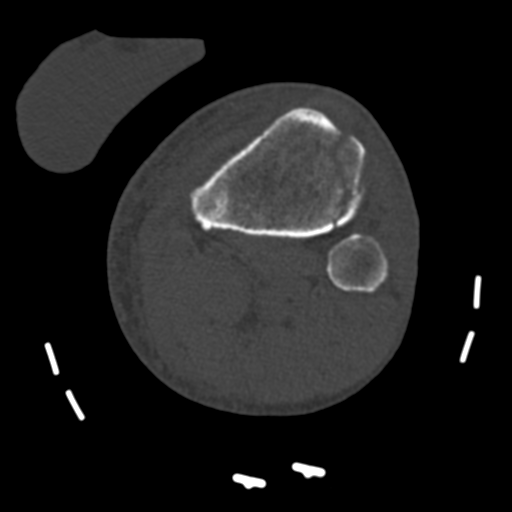
[im 51/132  bone]
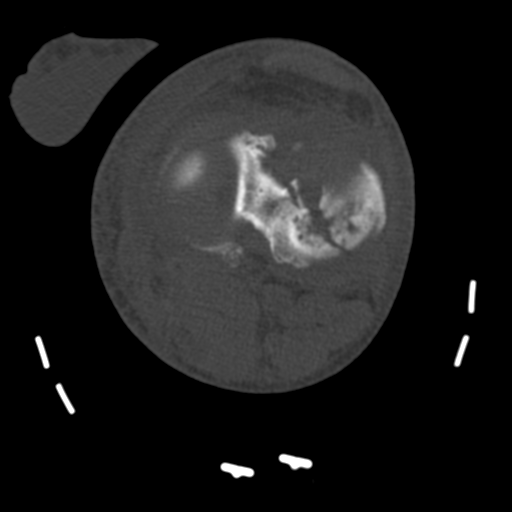
[im 71/132  bone]
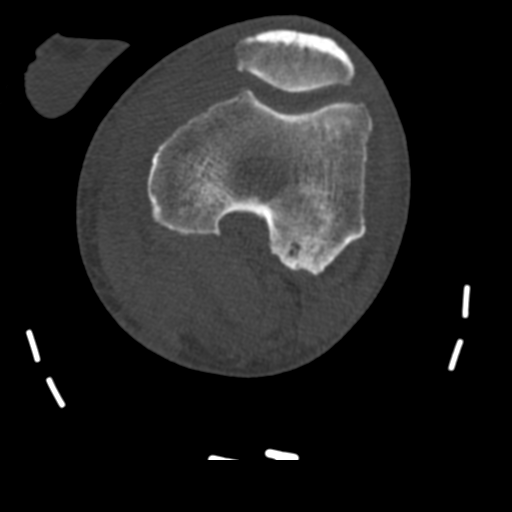
[im 81/132  soft-tissue]
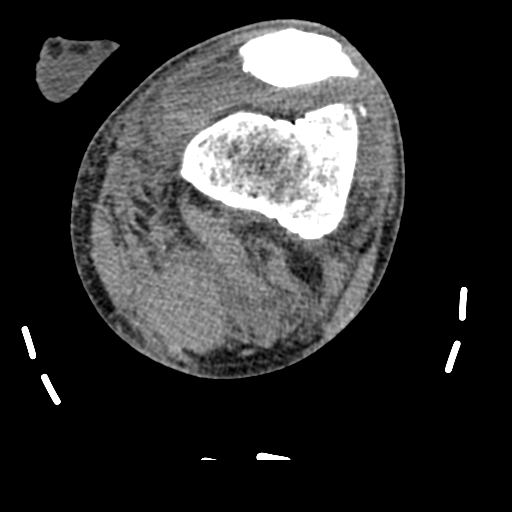
[im 81/132  bone]
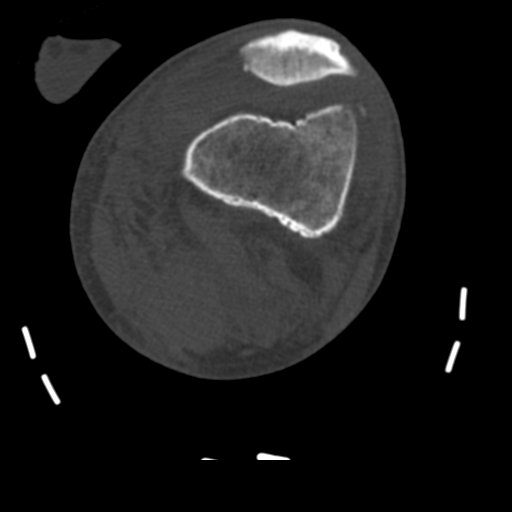
[im 101/132  bone]
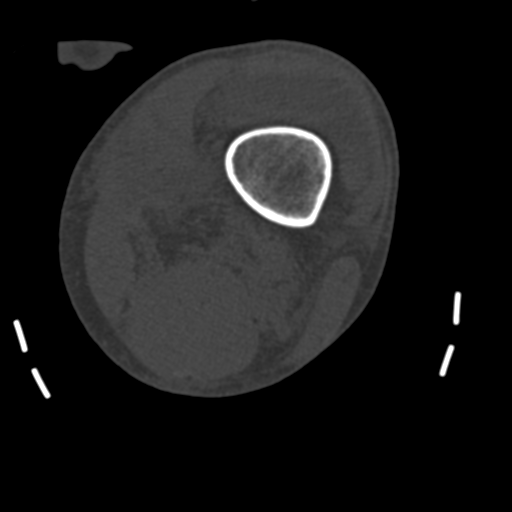
[im 121/132  bone]
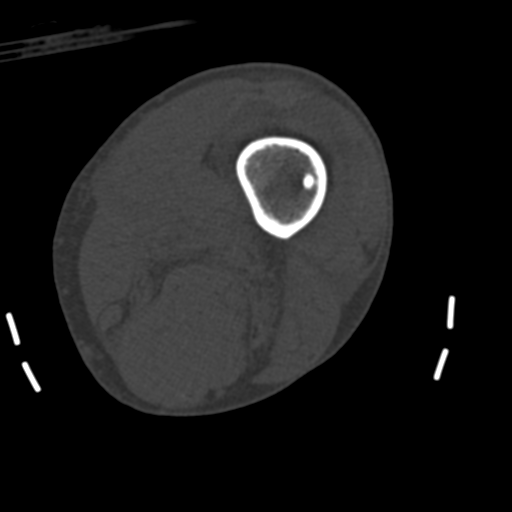

[Series 8: sagittal bone · sagittal · 0.39mm/px · 5 of 101 slices shown]
[im 17/101  bone]
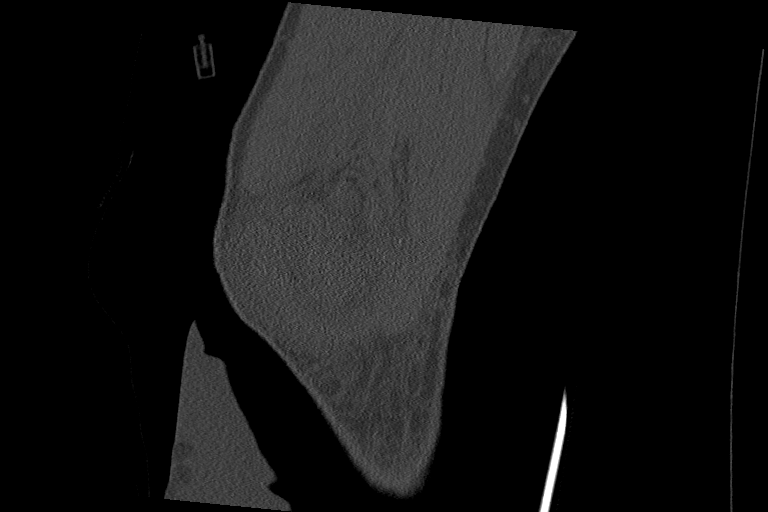
[im 34/101  bone]
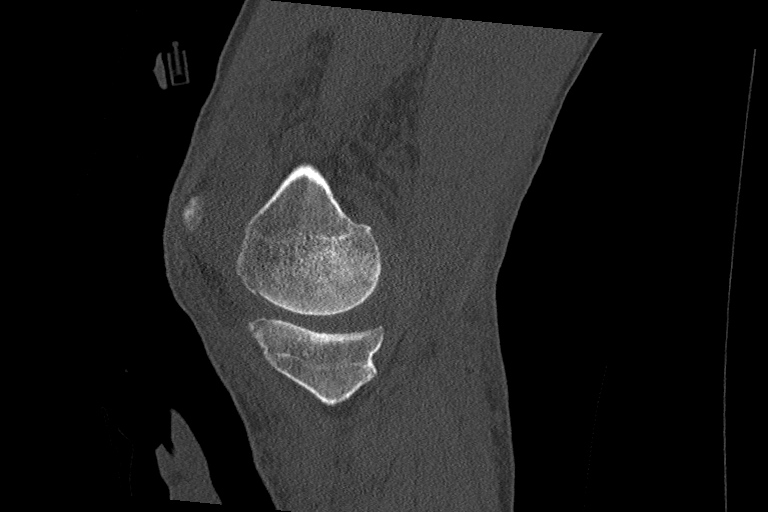
[im 51/101  bone]
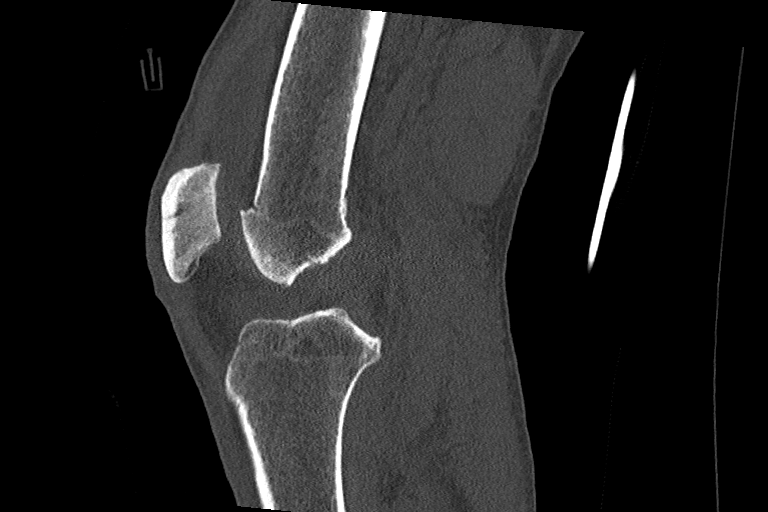
[im 67/101  bone]
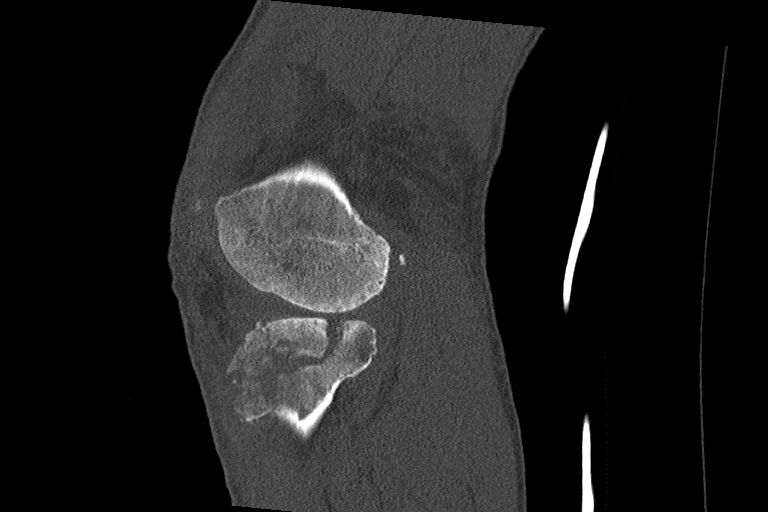
[im 84/101  bone]
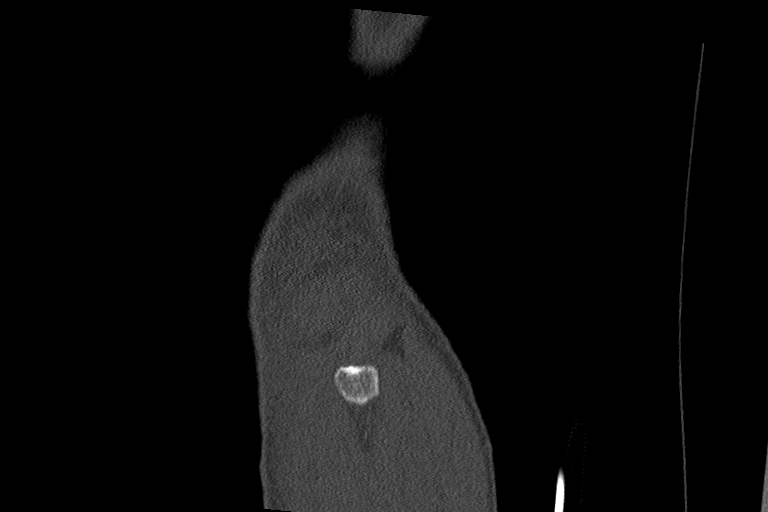

[Series 9: coronal st · coronal · 0.36mm/px · 3 of 113 slices shown]
[im 23/113  bone]
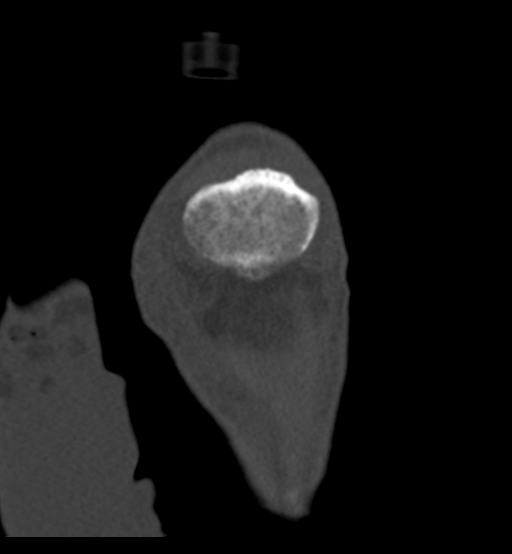
[im 45/113  bone]
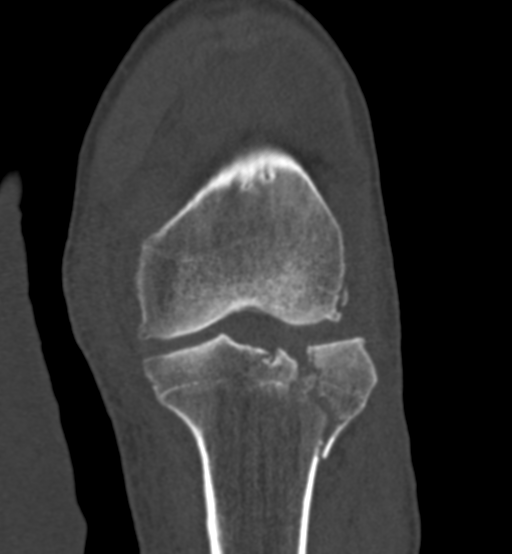
[im 68/113  bone]
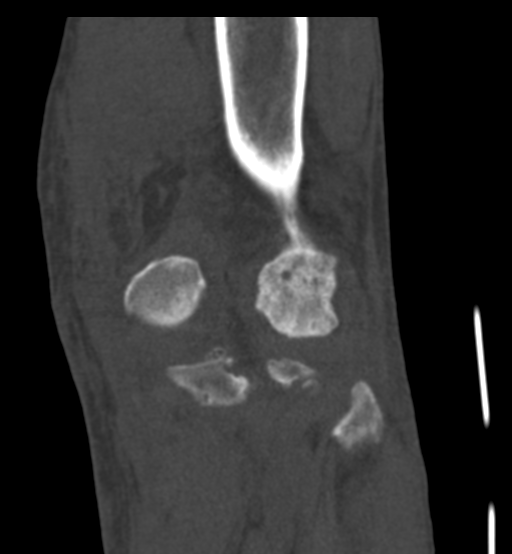

[15 of 33 positions shown; findings below may reference images not displayed]

FINDINGS: Bones/Joint/Cartilage

There is a comminuted fracture of the lateral tibial plateau. There
is a central 14 x 22 mm fragment depressed approximately 5 mm. The
patient has moderately severe osteoarthritis of the lateral
compartment with joint space narrowing and multiple cortical
erosions and subcortical cysts in the femoral condyles and tibial
plateau. There are less extensive subcortical cysts in the anterior
aspect of the medial compartment.

The fracture involves at least [DATE] of the lateral tibial plateau.

Large hemarthrosis.

There is a slightly impacted fracture of the neck of the fibula
without displacement.
IMPRESSION: 1. Comminuted depressed fracture of the lateral tibial plateau.
2. Moderately severe osteoarthritis the lateral compartment.
3. Slightly impacted fracture of the fibular neck.

## 2018-06-19 NOTE — Progress Notes (Deleted)
[facility]  PATIENT: Eugene [name] DOB: 07-24-1961   REASON FOR VISIT: Follow-up for seizure disorder HISTORY FROM:    HISTORY OF PRESENT ILLNESS: UPDATE 06/20/17: Since last visit, no seizures. Tolerating LEV 1000mg  twice a day. Also had a trip and fall in May 2018, thel left tibial plate fracture, s/p surgery. Now recovered from left knee surgery.   UPDATE 05/16/16: Since last visit, doing well. No sz. Tolerating LEV. Some right arm weakness when overheated. Some balance diff when not wearing brace in summertime (too hot to wear).   UPDATE 12/04/15: Since last visit, doing well. No new events. No sz. Tolerating meds.  UPDATE 09/03/15 ([facility]P): Since last visit, doing well. No seizures. Still drinking a little beer and smoking some cigarettes. Apparently, PCP checked labs 2 weeks ago, and due to some abnl, his LEV was reduced to 1000mg  BID and amantadine was added. Patient not sure why.   UPDATE 05/13/15 ([facility]P): Since last visit, doing well, no seizures. No new events. Tolerating meds.   UPDATE 11/10/14 ([facility]P): Since last visit, no seizures. Doing water aerobics for past 3 months. Slipped off some stairs last month, accidentally. No major injury fortunately.  UPDATE 05/07/14 (LL):  Since last visit, doing well, no seizures or stroke symptoms.  Having increased difficulty lately lifting right leg.  Seems to be dragging it more. He is tolerating Keppra well, taking 1500 mg bid. Labs done recently at Dr. Mathews Robinsons office were reportedly good. Blood pressure is well controlled, it is 114/73 in office today. He started back smoking cigarettes again, smoking less than half pack a day, knows he needs to quit.  UPDATE 11/11/13 LL: Patient returns for follow up and states he has been doing well, no convulsions since last visit. He reports that he has a tingling sensation in the right parietal area of his head sometimes which he states feels like when he used to smoke crack (which he  hasn't done in many years), but no seizure follows. He is tolerating Keppra well, taking 1500 mg bid.   UPDATE 05/24/13: Since last visit, patient had one visit to emergency room for possible seizure versus stroke. Currently patient has been having dental problems, 2 extractions and was placed on antibiotics and pain medication. One week later, patient was on the bus and had fairly sudden onset feeling as though he might have a partial seizure. Previous partial seizures involve convulsions on the right arm and right leg. Patient sat on the bus but did not go into convulsions. When he stood up he felt weakness in his right leg. Patient went to the hospital for further evaluation. He was admitted under TIA protocol. MRI of the brain was done which showed no acute findings. Patient was discharged on Plavix instead of aspirin. Since that time patient has done well. No further events. He does feel dizziness and has been seeing dark spots, since starting Plavix. He has not missed a seizure medications.   UPDATE 01/14/13: Doing well. Had 1 breakthrogh sz in Aug 2013, when he missed meds. Otherwise doing well.   UPDATE 02/01/12: Had another breakthrough sz (right arm/leg convulsions) in Feb 2013. Had arguement with daughter that evening by phone. Then had sz at 4am, when he woke up. No missed doses. No other factors.   PRIOR HPI: 57 year old right-handed male with history of intracerebral hemorrhage, here for evaluation of syncope versus seizure. The patient was admitted to [facility] in January 2012 for left frontal intracerebral hemorrhage  with right upper and lower extremity weakness. [facility] [place] [place]. He was doing well with improvement of right-sided weakness. On June 20, 2011, he was in the bathroom, shaving, when he developed lightheadedness, called to his sister for help, and fell to the ground. When she came to the bathroom she found him shaking for  approximately one to 2 minutes. He was profusely sweating. No tongue biting or incontinence. After the episode, patient had significant right upper and lower extremity weakness. He was somewhat confused for several hours after the event. He went to the emergency room, had CT scan of the head and was discharged with a diagnosis of syncope. Since that time he has had no further events.  REVIEW OF SYSTEMS: Full 14 system review of systems performed and notable only for those listed, all others are neg:  Constitutional: neg  Cardiovascular: neg Ear/Nose/Throat: neg  Skin: neg Eyes: neg Respiratory: neg Gastroitestinal: neg  Hematology/Lymphatic: neg  Endocrine: neg Musculoskeletal:neg Allergy/Immunology: neg Neurological: neg Psychiatric: neg Sleep : neg   ALLERGIES: No Known Allergies  HOME MEDICATIONS: Outpatient Medications Prior to Visit  Medication Sig Dispense Refill  . aspirin EC 81 MG tablet Take 81 mg by mouth daily.    Marland Kitchen. levETIRAcetam (KEPPRA) 1000 MG tablet Take 1 tablet (1,000 mg total) by mouth 2 (two) times daily. 180 tablet 4  . losartan-hydrochlorothiazide (HYZAAR) 100-25 MG per tablet Take 1 tablet by mouth every morning.     . Multiple Vitamin (MULTIVITAMIN WITH MINERALS) TABS Take 1 tablet by mouth every morning.    . pravastatin (PRAVACHOL) 20 MG tablet Take 20 mg by mouth every morning.    . verapamil (CALAN) 80 MG tablet Take 80 mg by mouth 3 (three) times daily.       No facility-administered medications prior to visit.     PAST MEDICAL HISTORY: Past Medical History:  Diagnosis Date  . Hypertension   . Seizures (HCC)   . Stroke [facility](HCC)     PAST SURGICAL HISTORY: Past Surgical History:  Procedure Laterality Date  . HERNIA REPAIR    . ORIF TIBIA PLATEAU Left 03/28/2017   Procedure: OPEN REDUCTION INTERNAL FIXATION (ORIF) TIBIAL PLATEAU;  Surgeon: Durene Romanslin, Matthew, MD;  Location: [facility];  Service: Orthopedics;  Laterality: Left;    FAMILY HISTORY: Family  History  Problem Relation Age of Onset  . Asthma Mother   . Diabetes Mother   . Other Father        natural causes  . Stroke Paternal Grandmother   . Stroke Paternal Grandfather   . Heart disease Neg Hx     SOCIAL HISTORY: Social History   Socioeconomic History  . Marital status: Single    Spouse name: Not on file  . Number of children: 2  . Years of education: 7712  . Highest education level: Not on file  Occupational History    Employer: UNEMPLOYED  Social Needs  . Financial resource strain: Not on file  . Food insecurity:    Worry: Not on file    Inability: Not on file  . Transportation needs:    Medical: Not on file    Non-medical: Not on file  Tobacco Use  . Smoking status: Current Every Day Smoker    Years: 0.00    Types: Cigarettes  . Smokeless tobacco: Never Used  . Tobacco comment: states he smokes maybe 2-3 cigarettes a day  Substance and Sexual Activity  . Alcohol use: Yes  .  Drug use: No  . Sexual activity: Not Currently  Lifestyle  . Physical activity:    Days per week: Not on file    Minutes per session: Not on file  . Stress: Not on file  Relationships  . Social connections:    Talks on phone: Not on file    Gets together: Not on file    Attends religious service: Not on file    Active member of club or organization: Not on file    Attends meetings of clubs or organizations: Not on file    Relationship status: Not on file  . Intimate partner violence:    Fear of current or ex partner: Not on file    Emotionally abused: Not on file    Physically abused: Not on file    Forced sexual activity: Not on file  Other Topics Concern  . Not on file  Social History Narrative   Pt lives at home with caregiver    Pt is right handed   Education 12   Caffeine 0     PHYSICAL EXAM  There were no vitals filed for this visit. There is no height or weight on file to calculate BMI.  Generalized: Well developed, in no acute distress  Head:  normocephalic and atraumatic,. Oropharynx benign  Neck: Supple, no carotid bruits  Cardiac: Regular rate rhythm, no murmur  Musculoskeletal: No deformity   Neurological examination   Mentation: Alert oriented to time, place, history taking. Attention span and concentration appropriate. Recent and remote memory intact.  Follows all commands speech and language fluent.   Cranial nerve II-XII: Fundoscopic exam reveals sharp disc margins.Pupils were equal round reactive to light extraocular movements were full, visual field were full on confrontational test. Facial sensation and strength were normal. hearing was intact to finger rubbing bilaterally. Uvula tongue midline. head turning and shoulder shrug were normal and symmetric.Tongue protrusion into cheek strength was normal. Motor: normal bulk and tone, full strength in the BUE, BLE, fine finger movements normal, no pronator drift. No focal weakness Sensory: normal and symmetric to light touch, pinprick, and  Vibration, proprioception  Coordination: finger-nose-finger, heel-to-shin bilaterally, no dysmetria Reflexes: Brachioradialis 2/2, biceps 2/2, triceps 2/2, patellar 2/2, Achilles 2/2, plantar responses were flexor bilaterally. Gait and Station: Rising up from seated position without assistance, normal stance,  moderate stride, good arm swing, smooth turning, able to perform tiptoe, and heel walking without difficulty. Tandem gait is steady  DIAGNOSTIC DATA (LABS, IMAGING, TESTING) - I reviewed patient records, labs, notes, testing and imaging myself where available.  Lab Results  Component Value Date   WBC 13.2 (H) 06/28/2017   HGB 13.2 06/28/2017   HCT 36.5 (L) 06/28/2017   MCV 80.2 06/28/2017   PLT 80 (L) 06/28/2017      Component Value Date/Time   NA 140 06/28/2017 2044   NA 139 12/25/2014 1039   K 5.8 (H) 06/28/2017 2044   K 3.8 12/25/2014 1039   CL 111 06/28/2017 2044   CO2 27 03/30/2017 0417   CO2 28 12/25/2014 1039    GLUCOSE 99 06/28/2017 2044   GLUCOSE 95 12/25/2014 1039   BUN 23 (H) 06/28/2017 2044   BUN 10.2 12/25/2014 1039   CREATININE 1.40 (H) 06/28/2017 2044   CREATININE 1.0 12/25/2014 1039   CALCIUM 8.6 (L) 03/30/2017 0417   CALCIUM 9.4 12/25/2014 1039   PROT 7.4 03/27/2017 2007   PROT 7.9 12/25/2014 1039   ALBUMIN 4.2 03/27/2017 2007   ALBUMIN 4.5 12/25/2014  1039   AST 37 03/27/2017 2007   AST 56 (H) 12/25/2014 1039   ALT 17 03/27/2017 2007   ALT 59 (H) 12/25/2014 1039   ALKPHOS 58 03/27/2017 2007   ALKPHOS 82 12/25/2014 1039   BILITOT 1.6 (H) 03/27/2017 2007   BILITOT 1.12 12/25/2014 1039   GFRNONAA >60 03/30/2017 0417   GFRAA >60 03/30/2017 0417   Lab Results  Component Value Date   CHOL 124 05/05/2013   HDL 43 05/05/2013   LDLCALC 67 05/05/2013   TRIG 72 05/05/2013   CHOLHDL 2.9 05/05/2013   Lab Results  Component Value Date   HGBA1C 4.5 05/04/2013    ASSESSMENT AND PLAN  57 y.o. year old male  has a past medical history of Hypertension, Seizures (HCC), and Stroke (HCC). here with *** 57 y.o. 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 Now with episode of loss of consciousness, convulsions, postictal right-sided weakness, postictal confusion on June 20, 2011. Another partial sz in Feb 2013 and Aug 2013. Event in June 2014 may have been partial seizure, seizure aura or TIA. No further episodes since that time.     Dx:  No diagnosis found.    PLAN:   I spent 15 minutes of face to face time with patient. Greater than 50% of time was spent in counseling and coordination of care with patient. In summary we discussed:   - continue levetiracetam 1000mg  twice a day - continue aspirin 81mg  - safety and seizure precautions reviewed     Nilda RiggsNancy Carolyn Telesha Deguzman, [facility] HospitalGNP, [facility]BC, APRN  [facility] [address], [address] [place], [place] [address] [phone]

## 2018-06-20 ENCOUNTER — Telehealth: Payer: Self-pay | Admitting: *Deleted

## 2018-06-20 ENCOUNTER — Ambulatory Visit: Payer: Self-pay | Admitting: Nurse Practitioner

## 2018-06-20 ENCOUNTER — Encounter: Payer: Self-pay | Admitting: Nurse Practitioner

## 2018-06-20 NOTE — Telephone Encounter (Signed)
Pt no showed for appointment today.

## 2018-06-20 NOTE — Telephone Encounter (Signed)
Spoke to pts mother Annice PihJackie, that pt missed appt today.  I relayed that the # we had (person living there stated no one with that name was living there).  She gave me new #.  [phone].  I LMVM for him on the new # to call us to r/s appt.  CM/NP had appt at 1415 today if that was good for him.

## 2018-06-21 NOTE — Telephone Encounter (Signed)
Pt called r/s his appt for 9/10 at 2:15. Confirmed # with pt stating the one in the chart is correct.

## 2018-07-11 ENCOUNTER — Ambulatory Visit: Payer: Medicare Other | Admitting: Podiatry

## 2018-07-16 NOTE — Progress Notes (Signed)
[facility]  PATIENT: Eugene [name] DOB: Feb 20, 1961   REASON FOR VISIT: Follow-up for seizure disorder history of stroke HISTORY FROM:patient    HISTORY OF PRESENT ILLNESS:UPDATE 9/10/2019CM Eugene [name], 57 year old male returns for follow-up with history of seizure disorder.  He is currently on LEV thousand milligrams twice daily without side effects.  In addition he is on aspirin.  He has an AFO to his right lower leg since his stroke.  He continues to work part-time.  He does not drive.  Blood pressure in the office today 129/80.  Labs are followed by his primary care.  He returns for reevaluation UPDATE 06/20/17: Since last visit, no seizures. Tolerating LEV 1000mg  twice a day. Also had a trip and fall in May 2018, thel left tibial plate fracture, s/p surgery. Now recovered from left knee surgery.   UPDATE 05/16/16: Since last visit, doing well. No sz. Tolerating LEV. Some right arm weakness when overheated. Some balance diff when not wearing brace in summertime (too hot to wear).   UPDATE 12/04/15: Since last visit, doing well. No new events. No sz. Tolerating meds.  UPDATE 09/03/15 (VRP): Since last visit, doing well. No seizures. Still drinking a little beer and smoking some cigarettes. Apparently, PCP checked labs 2 weeks ago, and due to some abnl, his LEV was reduced to 1000mg  BID and amantadine was added. Patient not sure why.   UPDATE 05/13/15 (VRP): Since last visit, doing well, no seizures. No new events. Tolerating meds.   UPDATE 11/10/14 (VRP): Since last visit, no seizures. Doing water aerobics for past 3 months. Slipped off some stairs last month, accidentally. No major injury fortunately.  UPDATE 05/07/14 (LL):  Since last visit, doing well, no seizures or stroke symptoms.  Having increased difficulty lately lifting right leg.  Seems to be dragging it more. He is tolerating Keppra well, taking 1500 mg bid. Labs done recently at Dr. Mathews Robinsons office were  reportedly good. Blood pressure is well controlled, it is 114/73 in office today. He started back smoking cigarettes again, smoking less than half pack a day, knows he needs to quit.  UPDATE 11/11/13 LL: Patient returns for follow up and states he has been doing well, no convulsions since last visit. He reports that he has a tingling sensation in the right parietal area of his head sometimes which he states feels like when he used to smoke crack (which he hasn't done in many years), but no seizure follows. He is tolerating Keppra well, taking 1500 mg bid.   UPDATE 05/24/13: Since last visit, patient had one visit to emergency room for possible seizure versus stroke. Currently patient has been having dental problems, 2 extractions and was placed on antibiotics and pain medication. One week later, patient was on the bus and had fairly sudden onset feeling as though he might have a partial seizure. Previous partial seizures involve convulsions on the right arm and right leg. Patient sat on the bus but did not go into convulsions. When he stood up he felt weakness in his right leg. Patient went to the hospital for further evaluation. He was admitted under TIA protocol. MRI of the brain was done which showed no acute findings. Patient was discharged on Plavix instead of aspirin. Since that time patient has done well. No further events. He does feel dizziness and has been seeing dark spots, since starting Plavix. He has not missed a seizure medications.   UPDATE 01/14/13: Doing well. Had 1 breakthrogh sz in Aug 2013,  when he missed meds. Otherwise doing well.   UPDATE 02/01/12: Had another breakthrough sz (right arm/leg convulsions) in Feb 2013. Had arguement with daughter that evening by phone. Then had sz at 4am, when he woke up. No missed doses. No other factors.   PRIOR HPI: 57 year old right-handed male with history of intracerebral hemorrhage, here for evaluation of syncope versus seizure. The patient was  admitted to [facility] in January 2012 for left frontal intracerebral hemorrhage with right upper and lower extremity weakness. He was discharged to rehabilitation in [facility]. He was doing well with improvement of right-sided weakness. On June 20, 2011, he was in the bathroom, shaving, when he developed lightheadedness, called to his sister for help, and fell to the ground. When she came to the bathroom she found him shaking for approximately one to 2 minutes. He was profusely sweating. No tongue biting or incontinence. After the episode, patient had significant right upper and lower extremity weakness. He was somewhat confused for several hours after the event. He went to the emergency room, had CT scan of the head and was discharged with a diagnosis of syncope. Since that time he has had no further events.  REVIEW OF SYSTEMS: Full 14 system review of systems performed and notable only for those listed, all others are neg:  Constitutional: neg  Cardiovascular: neg Ear/Nose/Throat: neg  Skin: neg Eyes: neg Respiratory: neg Gastroitestinal: neg  Hematology/Lymphatic: neg  Endocrine: neg Musculoskeletal: Walking difficulty Allergy/Immunology: neg Neurological: Seizure disorder Psychiatric: neg Sleep : neg   ALLERGIES: No Known Allergies  HOME MEDICATIONS: Outpatient Medications Prior to Visit  Medication Sig Dispense Refill  . aspirin EC 81 MG tablet Take 81 mg by mouth daily.    Marland Kitchen levETIRAcetam (KEPPRA) 1000 MG tablet Take 1 tablet (1,000 mg total) by mouth 2 (two) times daily. 180 tablet 4  . losartan-hydrochlorothiazide (HYZAAR) 100-25 MG per tablet Take 1 tablet by mouth every morning.     . Multiple Vitamin (MULTIVITAMIN WITH MINERALS) TABS Take 1 tablet by mouth every morning.    . pravastatin (PRAVACHOL) 20 MG tablet Take 20 mg by mouth every morning.    . verapamil (CALAN) 80 MG tablet Take 80 mg by mouth 3 (three) times daily.       No  facility-administered medications prior to visit.     PAST MEDICAL HISTORY: Past Medical History:  Diagnosis Date  . Hypertension   . Seizures (HCC)   . Stroke [facility])     PAST SURGICAL HISTORY: Past Surgical History:  Procedure Laterality Date  . HERNIA REPAIR    . ORIF TIBIA PLATEAU Left 03/28/2017   Procedure: OPEN REDUCTION INTERNAL FIXATION (ORIF) TIBIAL PLATEAU;  Surgeon: Durene Romans, MD;  Location: [facility];  Service: Orthopedics;  Laterality: Left;    FAMILY HISTORY: Family History  Problem Relation Age of Onset  . Asthma Mother   . Diabetes Mother   . Other Father        natural causes  . Stroke Paternal Grandmother   . Stroke Paternal Grandfather   . Heart disease Neg Hx     SOCIAL HISTORY: Social History   Socioeconomic History  . Marital status: Single    Spouse name: Not on file  . Number of children: 2  . Years of education: 20  . Highest education level: Not on file  Occupational History    Employer: UNEMPLOYED  Social Needs  . Financial resource strain: Not on file  . Food  insecurity:    Worry: Not on file    Inability: Not on file  . Transportation needs:    Medical: Not on file    Non-medical: Not on file  Tobacco Use  . Smoking status: Current Every Day Smoker    Years: 0.00    Types: Cigarettes  . Smokeless tobacco: Never Used  . Tobacco comment: states he smokes maybe 2-3 cigarettes a day  Substance and Sexual Activity  . Alcohol use: Yes  . Drug use: No  . Sexual activity: Not Currently  Lifestyle  . Physical activity:    Days per week: Not on file    Minutes per session: Not on file  . Stress: Not on file  Relationships  . Social connections:    Talks on phone: Not on file    Gets together: Not on file    Attends religious service: Not on file    Active member of club or organization: Not on file    Attends meetings of clubs or organizations: Not on file    Relationship status: Not on file  . Intimate partner violence:     Fear of current or ex partner: Not on file    Emotionally abused: Not on file    Physically abused: Not on file    Forced sexual activity: Not on file  Other Topics Concern  . Not on file  Social History Narrative   Pt lives at home with caregiver    Pt is right handed   Education 12   Caffeine 0     PHYSICAL EXAM  Vitals:   07/17/18 1342  BP: 129/80  Pulse: 73  Weight: 175 lb 6.4 oz (79.6 kg)  Height: 5\' 7"  (1.702 m)   Body mass index is 27.47 kg/m.  Generalized: Well developed, in no acute distress  Head: normocephalic and atraumatic,. Oropharynx benign  Neck: Supple, no carotid bruits  Cardiac: Regular rate rhythm, no murmur  Musculoskeletal: No deformity   Neurological examination   Mentation: Alert oriented to time, place, history taking. Attention span and concentration appropriate. Recent and remote memory intact.  Follows all commands speech and language fluent.   Cranial nerve II-XII: .Pupils were equal round reactive to light extraocular movements were full, visual field were full on confrontational test. Facial sensation and strength were normal. hearing was intact to finger rubbing bilaterally. Uvula tongue midline. head turning and shoulder shrug were normal and symmetric.Tongue protrusion into cheek strength was normal. Motor: normal bulk and tone, full strength in the BUE, BLE,except  2-3 distally with AFO on right Sensory: normal and symmetric to light touch,  Coordination: finger-nose-finger, heel-to-shin bilaterally, no dysmetria Reflexes: Trace and symmetric, plantar responses were flexor bilaterally. Gait and Station: Right hemiparetic gait no difficulty with turns DIAGNOSTIC DATA (LABS, IMAGING, TESTING) - I reviewed patient records, labs, notes, testing and imaging myself where available.  Lab Results  Component Value Date   WBC 13.2 (H) 06/28/2017   HGB 13.2 06/28/2017   HCT 36.5 (L) 06/28/2017   MCV 80.2 06/28/2017   PLT 80 (L) 06/28/2017       Component Value Date/Time   NA 140 06/28/2017 2044   NA 139 12/25/2014 1039   K 5.8 (H) 06/28/2017 2044   K 3.8 12/25/2014 1039   CL 111 06/28/2017 2044   CO2 27 03/30/2017 0417   CO2 28 12/25/2014 1039   GLUCOSE 99 06/28/2017 2044   GLUCOSE 95 12/25/2014 1039   BUN 23 (H) 06/28/2017 2044  BUN 10.2 12/25/2014 1039   CREATININE 1.40 (H) 06/28/2017 2044   CREATININE 1.0 12/25/2014 1039   CALCIUM 8.6 (L) 03/30/2017 0417   CALCIUM 9.4 12/25/2014 1039   PROT 7.4 03/27/2017 2007   PROT 7.9 12/25/2014 1039   ALBUMIN 4.2 03/27/2017 2007   ALBUMIN 4.5 12/25/2014 1039   AST 37 03/27/2017 2007   AST 56 (H) 12/25/2014 1039   ALT 17 03/27/2017 2007   ALT 59 (H) 12/25/2014 1039   ALKPHOS 58 03/27/2017 2007   ALKPHOS 82 12/25/2014 1039   BILITOT 1.6 (H) 03/27/2017 2007   BILITOT 1.12 12/25/2014 1039   GFRNONAA >60 03/30/2017 0417   GFRAA >60 03/30/2017 0417   Lab Results  Component Value Date   CHOL 124 05/05/2013   HDL 43 05/05/2013   LDLCALC 67 05/05/2013   TRIG 72 05/05/2013   CHOLHDL 2.9 05/05/2013   Lab Results  Component Value Date   HGBA1C 4.5 05/04/2013    ASSESSMENT AND PLAN  57 y.o. year old male  has a past medical history of Hypertension, Seizures (HCC), and Stroke (HCC). 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 Now with episode of loss of consciousness, convulsions, postictal right-sided weakness, postictal confusion on June 20, 2011. Another partial sz in Feb 2013 and Aug 2013. Event in June 2014 may have been partial seizure, seizure aura or TIA. No further episodes since that time.       PLAN:  Continue levetiracetam 1000mg  twice a day Continue aspirin 81mg  Safety and seizure precautions reviewed  F/U yearly Nilda Riggs, [facility], [facility], APRN  [facility] [address] [place],  [place] [address] [phone]

## 2018-07-17 ENCOUNTER — Encounter: Payer: Self-pay | Admitting: Nurse Practitioner

## 2018-07-17 ENCOUNTER — Ambulatory Visit (INDEPENDENT_AMBULATORY_CARE_PROVIDER_SITE_OTHER): Payer: Medicare Other | Admitting: Nurse Practitioner

## 2018-07-17 VITALS — BP 129/80 | HR 73 | Ht 67.0 in | Wt 175.4 lb

## 2018-07-17 DIAGNOSIS — I69359 Hemiplegia and hemiparesis following cerebral infarction affecting unspecified side: Secondary | ICD-10-CM

## 2018-07-17 DIAGNOSIS — R569 Unspecified convulsions: Secondary | ICD-10-CM | POA: Diagnosis not present

## 2018-07-17 MED ORDER — LEVETIRACETAM 1000 MG PO TABS: 1000.0000 mg | ORAL_TABLET | Freq: Two times a day (BID) | ORAL | 3 refills | Status: AC

## 2018-07-17 NOTE — Progress Notes (Signed)
I reviewed note and agree with plan.   VIKRAM R. PENUMALLI, MD 07/17/2018, 6:40 PM Certified in Neurology, Neurophysiology and Neuroimaging  [facility] [address], [address] [place], [place] [address] [phone]  

## 2018-07-17 NOTE — Patient Instructions (Signed)
Continue levetiracetam 1000mg  twice a day Continue aspirin 81mg  Safety and seizure precautions reviewed  F/U yearly

## 2018-12-06 ENCOUNTER — Emergency Department (HOSPITAL_COMMUNITY)
Admission: EM | Admit: 2018-12-06 | Discharge: 2018-12-06 | Disposition: A | Discharge status: Home / Self Care | Payer: Medicare Other | Attending: Emergency Medicine | Admitting: Emergency Medicine

## 2018-12-06 ENCOUNTER — Encounter (HOSPITAL_COMMUNITY): Payer: Self-pay | Admitting: Emergency Medicine

## 2018-12-06 ENCOUNTER — Other Ambulatory Visit: Payer: Self-pay

## 2018-12-06 DIAGNOSIS — I10 Essential (primary) hypertension: Secondary | ICD-10-CM | POA: Diagnosis not present

## 2018-12-06 DIAGNOSIS — Z79899 Other long term (current) drug therapy: Secondary | ICD-10-CM | POA: Diagnosis not present

## 2018-12-06 DIAGNOSIS — Z7982 Long term (current) use of aspirin: Secondary | ICD-10-CM | POA: Diagnosis not present

## 2018-12-06 DIAGNOSIS — Z7902 Long term (current) use of antithrombotics/antiplatelets: Secondary | ICD-10-CM | POA: Diagnosis not present

## 2018-12-06 DIAGNOSIS — R55 Syncope and collapse: Secondary | ICD-10-CM | POA: Insufficient documentation

## 2018-12-06 DIAGNOSIS — F17210 Nicotine dependence, cigarettes, uncomplicated: Secondary | ICD-10-CM | POA: Insufficient documentation

## 2018-12-06 LAB — BASIC METABOLIC PANEL
ANION GAP: 11 (ref 5–15)
BUN: 20 mg/dL (ref 6–20)
CALCIUM: 9.9 mg/dL (ref 8.9–10.3)
CO2: 23 mmol/L (ref 22–32)
CREATININE: 1.65 mg/dL — AB (ref 0.61–1.24)
Chloride: 104 mmol/L (ref 98–111)
GFR, EST AFRICAN AMERICAN: 53 mL/min — AB (ref >60)
GFR, EST NON AFRICAN AMERICAN: 45 mL/min — AB (ref >60)
GLUCOSE: 101 mg/dL — AB (ref 70–99)
Potassium: 3.2 mmol/L — ABNORMAL LOW (ref 3.5–5.1)
Sodium: 138 mmol/L (ref 135–145)

## 2018-12-06 LAB — CBC
HEMATOCRIT: 38.9 % — AB (ref 39.0–52.0)
Hemoglobin: 13.9 g/dL (ref 13.0–17.0)
MCH: 29.6 pg (ref 26.0–34.0)
MCHC: 35.7 g/dL (ref 30.0–36.0)
MCV: 82.9 fL (ref 80.0–100.0)
NRBC: 0.2 % (ref 0.0–0.2)
PLATELETS: 129 10*3/uL — AB (ref 150–400)
RBC: 4.69 MIL/uL (ref 4.22–5.81)
RDW: 19.5 % — AB (ref 11.5–15.5)
WBC: 12.4 10*3/uL — AB (ref 4.0–10.5)

## 2018-12-06 MED ORDER — SODIUM CHLORIDE 0.9 % IV SOLN: 1000.0000 mL | INTRAVENOUS | Status: DC

## 2018-12-06 MED ORDER — SODIUM CHLORIDE 0.9 % IV BOLUS (SEPSIS)
1000.0000 mL | Freq: Once | INTRAVENOUS | Status: AC
Start: 2018-12-06 — End: 2018-12-06
  Administered 2018-12-06: 1000 mL via INTRAVENOUS

## 2018-12-06 NOTE — ED Provider Notes (Signed)
[facility] Provider Note   CSN: [id number] Arrival date & time: 12/06/18  1956     History   Chief Complaint Chief Complaint  Patient presents with  . Near Syncope    HPI Tressia MinersStepfon Boissonneault is a 58 y.o. male.  HPI Patient presented to the emergency room for evaluation of near syncope.  Patient states he was working [place] [employer].  He smelled a strong odor of grease cooking.  Patient states he started to make him sick to his stomach.  He felt nauseated and thought he was going to throw up.  He then began feeling lightheaded and diaphoretic.  He felt like he was going to pass out.  EMS was called.  They found the patient diaphoretic.  His blood pressure was low.  EKG showed concerning findings so they activated a code STEMI.  Patient denies any trouble with any chest pain.  He is not having any shortness of breath.  He is not in any pain whatsoever.  Patient was treated with IV fluids.  He is feeling much better now.  His blood pressure has normalized and he has no complaints whatsoever. Past Medical History:  Diagnosis Date  . Hypertension   . Seizures (HCC)   . Stroke [facility](HCC)     Patient Active Problem List   Diagnosis Date Noted  . Overweight (BMI 25.0-29.9) 03/29/2017  . Tibial plateau fracture, left, closed, initial encounter 03/27/2017  . Current smoker 05/07/2014  . Partial seizure (HCC) 05/24/2013  . TIA (transient ischemic attack) 05/04/2013  . History of hemorrhagic stroke with residual hemiparesis (HCC) 05/04/2013  . Hypertension 05/04/2013  . History of drug abuse (HCC) 05/04/2013  . Thrombocytopenia (HCC) 05/04/2013    Past Surgical History:  Procedure Laterality Date  . HERNIA REPAIR    . ORIF TIBIA PLATEAU Left 03/28/2017   Procedure: OPEN REDUCTION INTERNAL FIXATION (ORIF) TIBIAL PLATEAU;  Surgeon: Durene Romanslin, Matthew, MD;  Location: [facility];  Service: Orthopedics;  Laterality: Left;        Home Medications    Prior to  Admission medications   Medication Sig Start Date End Date Taking? Authorizing Provider  aspirin EC 81 MG tablet Take 81 mg by mouth daily.    [provider]  levETIRAcetam (KEPPRA) 1000 MG tablet Take 1 tablet (1,000 mg total) by mouth 2 (two) times daily. 07/17/18   Nilda RiggsMartin, Nancy Carolyn, NP  losartan-hydrochlorothiazide (HYZAAR) 100-25 MG per tablet Take 1 tablet by mouth every morning.  10/08/13   [provider]  Multiple Vitamin (MULTIVITAMIN WITH MINERALS) TABS Take 1 tablet by mouth every morning.    [provider]  pravastatin (PRAVACHOL) 20 MG tablet Take 20 mg by mouth every morning.    [provider]  verapamil (CALAN) 80 MG tablet Take 80 mg by mouth 3 (three) times daily.      [provider]    Family History Family History  Problem Relation Age of Onset  . Asthma Mother   . Diabetes Mother   . Other Father        natural causes  . Stroke Paternal Grandmother   . Stroke Paternal Grandfather   . Heart disease Neg Hx     Social History Social History   Tobacco Use  . Smoking status: Current Every Day Smoker    Years: 0.00    Types: Cigarettes  . Smokeless tobacco: Never Used  . Tobacco comment: states he smokes maybe 2-3 cigarettes a day  Substance Use Topics  . Alcohol use: Yes  . Drug use: No     Allergies   Patient has no known allergies.   Review of Systems Review of Systems  All other systems reviewed and are negative.    Physical Exam Updated Vital Signs BP 104/72   Pulse 66   Temp (!) 97.5 F (36.4 C) (Oral)   Resp 12   Ht 1.676 m (5\' 6" )   Wt 85.3 kg   SpO2 97%   BMI 30.34 kg/m   Physical Exam Vitals signs and nursing note reviewed.  Constitutional:      General: He is not in acute distress.    Appearance: He is well-developed.  HENT:     Head: Normocephalic and atraumatic.     Right Ear: External ear normal.     Left Ear: External ear normal.  Eyes:     General: No scleral  icterus.       Right eye: No discharge.        Left eye: No discharge.     Conjunctiva/sclera: Conjunctivae normal.  Neck:     Musculoskeletal: Neck supple.     Trachea: No tracheal deviation.  Cardiovascular:     Rate and Rhythm: Normal rate and regular rhythm.  Pulmonary:     Effort: Pulmonary effort is normal. No respiratory distress.     Breath sounds: Normal breath sounds. No stridor. No wheezing or rales.  Abdominal:     General: Bowel sounds are normal. There is no distension.     Palpations: Abdomen is soft.     Tenderness: There is no abdominal tenderness. There is no guarding or rebound.  Musculoskeletal:        General: No tenderness.  Skin:    General: Skin is warm and dry.     Findings: No rash.  Neurological:     Mental Status: He is alert.     Cranial Nerves: No cranial nerve deficit (no facial droop, extraocular movements intact, no slurred speech).     Sensory: No sensory deficit.     Motor: No abnormal muscle tone or seizure activity.     Coordination: Coordination normal.      ED Treatments / Results  Labs (all labs ordered are listed, but only abnormal results are displayed) Labs Reviewed  CBC - Abnormal; Notable for the following components:      Result Value   WBC 12.4 (*)    HCT 38.9 (*)    RDW 19.5 (*)    Platelets 129 (*)    All other components within normal limits  BASIC METABOLIC PANEL - Abnormal; Notable for the following components:   Potassium 3.2 (*)    Glucose, Bld 101 (*)    Creatinine, Ser 1.65 (*)    GFR calc non Af Amer 45 (*)    GFR calc Af Amer 53 (*)    All other components within normal limits    EKG EKG Interpretation  Date/Time:  Thursday December 06 2018 20:00:42 EST Ventricular Rate:  72 PR Interval:    QRS Duration: 107 QT Interval:  402 QTC Calculation: 440 R Axis:   78 Text Interpretation:  Sinus rhythm ST elevation suggests acute pericarditis No significant change since last tracing Confirmed by Linwood Dibbles  [phone]) on 12/06/2018 8:04:56 PM   Radiology No results found.  Procedures Procedures (including critical care time)  Medications Ordered in ED Medications  sodium chloride 0.9 % bolus 1,000 mL (1,000 mLs Intravenous New Bag/Given  12/06/18 2007)    Followed by  0.9 %  sodium chloride infusion (has no administration in time range)     Initial Impression / Assessment and Plan / ED Course  I have reviewed the triage vital signs and the nursing notes.  Pertinent labs & imaging results that were available during my care of the patient were reviewed by me and considered in my medical decision making (see chart for details).  Clinical Course as of Dec 06 2199  Thu Dec 06, 2018  2007 Dr. Eldridge DaceVaranasi reviewed the EKG prior to the patient's arrival.  Code STEMI was canceled   [JK]  2159 Patient was monitored in the emergency room.  He continues to feel well.  He denies any trouble with any chest pain or shortness of breath.  Laboratory tests are notable for mild increase in his creatinine.  Potassium was slightly decreased at 3.2.  He does have a slight increase in his white blood cell count of 12.4 but the patient does not have any signs or symptoms of infection.   [JK]    Clinical Course User Index [JK] Linwood DibblesKnapp, Ahad Colarusso, MD    Symptoms consistent with vasovagal near syncope.  Patient symptoms were triggered by a strong odor.  He denies any trouble with chest pain or shortness of breath.  He has not had any abdominal pain.  All his symptoms resolved prior to arrival.  Mild electrolyte abnormalities noted.  Discussed outpatient follow-up with his primary care doctor.  Final Clinical Impressions(s) / ED Diagnoses   Final diagnoses:  Vasovagal syncope    ED Discharge Orders    None       Linwood DibblesKnapp, Roe Wilner, MD 12/06/18 2201

## 2018-12-06 NOTE — ED Notes (Signed)
Pt arrived [facility] from work where he had an episode of nausea, diaphoresis, and near syncope. Pt did not lose consciousness, and reports feeling significantly better at this time. Denies CP. EMS reports pt was hypotensive with systolic in the 80s and 90s pt given NS en route last BP 92/50, SR with ST elevation in leads V2,3 and 4. STEMI activated in the field.

## 2018-12-06 NOTE — Discharge Instructions (Addendum)
Drink plenty of fluids, rest, follow-up with your primary care doctor to have your creatinine level rechecked

## 2018-12-21 ENCOUNTER — Other Ambulatory Visit: Payer: Self-pay | Admitting: Nurse Practitioner

## 2019-05-08 ENCOUNTER — Ambulatory Visit: Payer: Medicare Other | Admitting: Nurse Practitioner

## 2019-05-09 ENCOUNTER — Ambulatory Visit: Payer: 59 | Admitting: Nurse Practitioner

## 2019-05-09 ENCOUNTER — Encounter: Payer: Self-pay | Admitting: Nurse Practitioner

## 2019-05-13 ENCOUNTER — Telehealth: Payer: Self-pay

## 2019-05-13 NOTE — Telephone Encounter (Signed)
I called pt to notify him his letter is ready to be picked up he stated he will pick it up on tomorrow 07/07. YRL,RMA

## 2019-07-30 ENCOUNTER — Encounter: Payer: Self-pay | Admitting: Family Medicine

## 2019-07-30 ENCOUNTER — Ambulatory Visit: Payer: Medicare Other | Admitting: Diagnostic Neuroimaging

## 2019-07-30 ENCOUNTER — Other Ambulatory Visit: Payer: Self-pay

## 2019-07-30 ENCOUNTER — Ambulatory Visit (INDEPENDENT_AMBULATORY_CARE_PROVIDER_SITE_OTHER): Payer: 59 | Admitting: Family Medicine

## 2019-07-30 VITALS — BP 121/71 | HR 67 | Temp 95.5°F | Ht 66.0 in | Wt 178.0 lb

## 2019-07-30 DIAGNOSIS — R569 Unspecified convulsions: Secondary | ICD-10-CM

## 2019-07-30 DIAGNOSIS — I69359 Hemiplegia and hemiparesis following cerebral infarction affecting unspecified side: Secondary | ICD-10-CM

## 2019-07-30 NOTE — Patient Instructions (Signed)
Continue levetiracetam 1000mg  twice daily  Follow up in 1 year  Seizure, Adult A seizure is a sudden burst of abnormal electrical activity in the brain. Seizures usually last from 30 seconds to 2 minutes. They can cause many different symptoms. Usually, seizures are not harmful unless they last a long time. What are the causes? Common causes of this condition include:  Fever or infection.  Conditions that affect the brain, such as: ? A brain abnormality that you were born with. ? A brain or head injury. ? Bleeding in the brain. ? A tumor. ? Stroke. ? Brain disorders such as autism or cerebral palsy.  Low blood sugar.  Conditions that are passed from parent to child (are inherited).  Problems with substances, such as: ? Having a reaction to a drug or a medicine. ? Suddenly stopping the use of a substance (withdrawal). In some cases, the cause may not be known. A person who has repeated seizures over time without a clear cause has a condition called epilepsy. What increases the risk? You are more likely to get this condition if you have:  A family history of epilepsy.  Had a seizure in the past.  A brain disorder.  A history of head injury, lack of oxygen at birth, or strokes. What are the signs or symptoms? There are many types of seizures. The symptoms vary depending on the type of seizure you have. Examples of symptoms during a seizure include:  Shaking (convulsions).  Stiffness in the body.  Passing out (losing consciousness).  Head nodding.  Staring.  Not responding to sound or touch.  Loss of bladder control and bowel control. Some people have symptoms right before and right after a seizure happens. Symptoms before a seizure may include:  Fear.  Worry (anxiety).  Feeling like you may vomit (nauseous).  Feeling like the room is spinning (vertigo).  Feeling like you saw or heard something before (dj vu).  Odd tastes or smells.  Changes in how  you see. You may see flashing lights or spots. Symptoms after a seizure happens can include:  Confusion.  Sleepiness.  Headache.  Weakness on one side of the body. How is this treated? Most seizures will stop on their own in under 5 minutes. In these cases, no treatment is needed. Seizures that last longer than 5 minutes will usually need treatment. Treatment can include:  Medicines given through an IV tube.  Avoiding things that are known to cause your seizures. These can include medicines that you take for another condition.  Medicines to treat epilepsy.  Surgery to stop the seizures. This may be needed if medicines do not help. Follow these instructions at home: Medicines  Take over-the-counter and prescription medicines only as told by your doctor.  Do not eat or drink anything that may keep your medicine from working, such as alcohol. Activity  Do not do any activities that would be dangerous if you had another seizure, like driving or swimming. Wait until your doctor says it is safe for you to do them.  If you live in the [place] ask your local DMV (department of motor vehicles) when you can drive.  Get plenty of rest. Teaching others Teach friends and family what to do when you have a seizure. They should:  Lay you on the ground.  Protect your head and body.  Loosen any tight clothing around your neck.  Turn you on your side.  Not hold you down.  Not put anything into your  mouth.  Know whether or not you need emergency care.  Stay with you until you are better.  General instructions  Contact your doctor each time you have a seizure.  Avoid anything that gives you seizures.  Keep a seizure diary. Write down: ? What you think caused each seizure. ? What you remember about each seizure.  Keep all follow-up visits as told by your doctor. This is important. Contact a doctor if:  You have another seizure.  You have seizures more often.  There is any  change in what happens during your seizures.  You keep having seizures with treatment.  You have symptoms of being sick or having an infection. Get help right away if:  You have a seizure that: ? Lasts longer than 5 minutes. ? Is different than seizures you had before. ? Makes it harder to breathe. ? Happens after you hurt your head.  You have any of these symptoms after a seizure: ? Not being able to speak. ? Not being able to use a part of your body. ? Confusion. ? A bad headache.  You have two or more seizures in a row.  You do not wake up right after a seizure.  You get hurt during a seizure. These symptoms may be an emergency. Do not wait to see if the symptoms will go away. Get medical help right away. Call your local emergency services (911 in the [place] Do not drive yourself to the hospital. Summary  Seizures usually last from 30 seconds to 2 minutes. Usually, they are not harmful unless they last a long time.  Do not eat or drink anything that may keep your medicine from working, such as alcohol.  Teach friends and family what to do when you have a seizure.  Contact your doctor each time you have a seizure. This information is not intended to replace advice given to you by your health care provider. Make sure you discuss any questions you have with your health care provider. Document Released: 04/11/2008 Document Revised: 01/11/2019 Document Reviewed: 01/11/2019 Elsevier Patient Education  2020 [employer].

## 2019-07-30 NOTE — Progress Notes (Signed)
PATIENT: Eugene Eugene [name] DOB: 1961-06-08  REASON FOR VISIT: follow up HISTORY FROM: patient  Chief Complaint  Patient presents with   Follow-up    Rm corner hall, no seizures.   Seizures     HISTORY OF PRESENT ILLNESS: Today 07/30/19 Eugene Eugene [name] is a 58 y.o. Eugene [name] here today for follow up for seizure disorder. He continues levetiracetam 1000mg  twice daily.  He is tolerating medication well without obvious adverse effects.  No seizure activity. No missed doses of medication. He is followed by primary care closely.  He continues aspirin 81 mg and pravastatin 20 mg.  Blood pressures have been normal.  Today's reading is 121/71.  He was last seen about 2 weeks ago.  He reports labs were drawn at that visit.  He is seen by [facility]. He does continue to wear right lower AFO.  He denies falls.  No strokelike symptoms.  He was evaluated in July ER for concerns of dehydration. He has increased water intake.  He states that he has felt great since. He is feeling well without complaints.  HISTORY: (copied from Garrison Martin's note on 07/17/2018)  UPDATE 9/10/2019CM Eugene Eugene [name], 58 year old Eugene [name] returns for follow-up with history of seizure disorder.  He is currently on LEV thousand milligrams twice daily without side effects.  In addition he is on aspirin.  He has an AFO to his right lower leg since his stroke.  He continues to work part-time.  He does not drive.  Blood pressure in the office today 129/80.  Labs are followed by his primary care.  He returns for reevaluation  UPDATE 06/20/17: Since last visit, no seizures. Tolerating LEV 1000mg  twice a day. Also had a trip and fall in May 2018, thel left tibial plate fracture, s/p surgery. Now recovered from left knee surgery.  UPDATE 05/16/16: Since last visit, doing well. No sz. Tolerating LEV. Some right arm weakness when overheated. Some balance diff when not wearing brace in summertime (too hot to wear).   UPDATE 12/04/15:  Since last visit, doing well. No new events. No sz. Tolerating meds.  UPDATE 09/03/15 (VRP): Since last visit, doing well. No seizures. Still drinking a little beer and smoking some cigarettes. Apparently, PCP checked labs 2 weeks ago, and due to some abnl, his LEV was reduced to 1000mg  BID and amantadine was added. Patient not sure why.   UPDATE 05/13/15 (VRP): Since last visit, doing well, no seizures. No new events. Tolerating meds.   UPDATE 11/10/14 (VRP): Since last visit, no seizures. Doing water aerobics for past 3 months. Slipped off some stairs last month, accidentally. No major injury fortunately.  UPDATE 05/07/14 (LL): Since last visit, doing well, no seizures or stroke symptoms. Having increased difficulty lately lifting right leg. Seems to be dragging it more. He is tolerating Keppra well, taking 1500 mg bid. Labs done recently at Dr. Mathews Robinsons office were reportedly good. Blood pressure is well controlled, it is 114/73 in office today. He started back smoking cigarettes again, smoking less than half pack a day, knows he needs to quit.  UPDATE 11/11/13 LL: Patient returns for follow up and states he has been doing well, no convulsions since last visit. He reports that he has a tingling sensation in the right parietal area of his head sometimes which he states feels like when he used to smoke crack (which he hasn't done in many years), but no seizure follows. He is tolerating Keppra well, taking 1500 mg bid.  UPDATE 05/24/13: Since last visit, patient had one visit to emergency room for possible seizure versus stroke. Currently patient has been having dental problems, 2 extractions and was placed on antibiotics and pain medication. One week later, patient was on the bus and had fairly sudden onset feeling as though he might have a partial seizure. Previous partial seizures involve convulsions on the right arm and right leg. Patient sat on the bus but did not go into convulsions. When he  stood up he felt weakness in his right leg. Patient went to the hospital for further evaluation. He was admitted under TIA protocol. MRI of the brain was done which showed no acute findings. Patient was discharged on Plavix instead of aspirin. Since that time patient has done well. No further events. He does feel dizziness and has been seeing dark spots, since starting Plavix. He has not missed a seizure medications.   UPDATE 01/14/13: Doing well. Had 1 breakthrogh sz in Aug 2013, when he missed meds. Otherwise doing well.   UPDATE 02/01/12: Had another breakthrough sz (right arm/leg convulsions) in Feb 2013. Had arguement with daughter that evening by phone. Then had sz at 4am, when he woke up. No missed doses. No other factors.   PRIOR HPI: 58 year old right-handed Eugene [name] with history of intracerebral hemorrhage, here for evaluation of syncope versus seizure. The patient was [facility] in January 2012 for left frontal intracerebral hemorrhage with right upper and lower extremity weakness. [facility]. He was doing well with improvement of right-sided weakness. On June 20, 2011, he was in the bathroom, shaving, when he developed lightheadedness, called to his sister for help, and fell to the ground. When she came to the bathroom she found him shaking for approximately one to 2 minutes. He was profusely sweating. No tongue biting or incontinence. After the episode, patient had significant right upper and lower extremity weakness. He was somewhat confused for several hours after the event. He went to the emergency room, had CT scan of the head and was discharged with a diagnosis of syncope. Since that time he has had no further events.   REVIEW OF SYSTEMS: Out of a complete 14 system review of symptoms, the patient complains only of the following symptoms, none and all other reviewed systems are negative.   ALLERGIES: No Known  Allergies  HOME MEDICATIONS: Outpatient Medications Prior to Visit  Medication Sig Dispense Refill   aspirin EC 81 MG tablet Take 81 mg by mouth daily.     levETIRAcetam (KEPPRA) 1000 MG tablet Take 1 tablet (1,000 mg total) by mouth 2 (two) times daily. 180 tablet 3   losartan-hydrochlorothiazide (HYZAAR) 100-25 MG per tablet Take 1 tablet by mouth every morning.      Multiple Vitamin (MULTIVITAMIN WITH MINERALS) TABS Take 1 tablet by mouth every morning.     mupirocin ointment (BACTROBAN) 2 % APPLY TO AFFECTED AREA EVERY DAY     potassium chloride (MICRO-K) 10 MEQ CR capsule TAKE 1 CAPSULE BY MOUTH EVERY DAY WITH FOOD     pravastatin (PRAVACHOL) 20 MG tablet Take 20 mg by mouth every morning.     verapamil (CALAN) 80 MG tablet Take 80 mg by mouth 3 (three) times daily.       No facility-administered medications prior to visit.     PAST MEDICAL HISTORY: Past Medical History:  Diagnosis Date   Hypertension    Seizures (HCC)    Stroke (  HCC)     PAST SURGICAL HISTORY: Past Surgical History:  Procedure Laterality Date   HERNIA REPAIR     ORIF TIBIA PLATEAU Left 03/28/2017   Procedure: OPEN REDUCTION INTERNAL FIXATION (ORIF) TIBIAL PLATEAU;  Surgeon: Durene Romans, MD;  Location: [facility];  Service: Orthopedics;  Laterality: Left;    FAMILY HISTORY: Family History  Problem Relation Age of Onset   Asthma Mother    Diabetes Mother    Other Father        natural causes   Stroke Paternal Grandmother    Stroke Paternal Grandfather    Heart disease Neg Hx     SOCIAL HISTORY: Social History   Socioeconomic History   Marital status: Single    Spouse name: Not on file   Number of children: 2   Years of education: 12   Highest education level: Not on file  Occupational History    Employer: UNEMPLOYED  Social Network engineer strain: Not on file   Food insecurity    Worry: Not on file    Inability: Not on file   Transportation needs     Medical: Not on file    Non-medical: Not on file  Tobacco Use   Smoking status: Current Every Day Smoker    Years: 0.00    Types: Cigarettes   Smokeless tobacco: Never Used   Tobacco comment: states he smokes maybe 2-3 cigarettes a day  Substance and Sexual Activity   Alcohol use: Yes   Drug use: No   Sexual activity: Not Currently  Lifestyle   Physical activity    Days per week: Not on file    Minutes per session: Not on file   Stress: Not on file  Relationships   Social connections    Talks on phone: Not on file    Gets together: Not on file    Attends religious service: Not on file    Active member of club or organization: Not on file    Attends meetings of clubs or organizations: Not on file    Relationship status: Not on file   Intimate partner violence    Fear of current or ex partner: Not on file    Emotionally abused: Not on file    Physically abused: Not on file    Forced sexual activity: Not on file  Other Topics Concern   Not on file  Social History Narrative   Pt lives at home with caregiver    Pt is right handed   Education 12   Caffeine 0      PHYSICAL EXAM  Vitals:   07/30/19 0809  BP: 121/71  Pulse: 67  Temp: (!) 95.5 F (35.3 C)  Weight: 178 lb (80.7 kg)  Height: 5\' 6"  (1.676 m)   Body mass index is 28.73 kg/m.  Generalized: Well developed, in no acute distress  Cardiology: normal rate and rhythm, no murmur noted Respiratory: Clear to auscultation bilaterally Neurological examination  Mentation: Alert oriented to time, place, history taking. Follows all commands speech and language fluent Cranial nerve II-XII: Pupils were equal round reactive to light. Extraocular movements were full, visual field were full on confrontational test. Facial sensation and strength were normal. Uvula tongue midline. Head turning and shoulder shrug  were normal and symmetric. Motor: The motor testing reveals 5 over 5 strength of bilateral upper and  left lower extremities. 3+/4 of right lower with hip flexion. Good symmetric motor tone is noted throughout.  Sensory: Sensory  testing is intact to soft touch on all 4 extremities. No evidence of extinction is noted.  Coordination: Cerebellar testing reveals good finger-nose-finger and heel-to-shin on left, right leg unable to test due to weakness.  Gait and station: Gait is   DIAGNOSTIC DATA (LABS, IMAGING, TESTING) - I reviewed patient records, labs, notes, testing and imaging myself where available.  No flowsheet data found.   Lab Results  Component Value Date   WBC 12.4 (H) 12/06/2018   HGB 13.9 12/06/2018   HCT 38.9 (L) 12/06/2018   MCV 82.9 12/06/2018   PLT 129 (L) 12/06/2018      Component Value Date/Time   NA 138 12/06/2018 2022   NA 139 12/25/2014 1039   K 3.2 (L) 12/06/2018 2022   K 3.8 12/25/2014 1039   CL 104 12/06/2018 2022   CO2 23 12/06/2018 2022   CO2 28 12/25/2014 1039   GLUCOSE 101 (H) 12/06/2018 2022   GLUCOSE 95 12/25/2014 1039   BUN 20 12/06/2018 2022   BUN 10.2 12/25/2014 1039   CREATININE 1.65 (H) 12/06/2018 2022   CREATININE 1.0 12/25/2014 1039   CALCIUM 9.9 12/06/2018 2022   CALCIUM 9.4 12/25/2014 1039   PROT 7.4 03/27/2017 2007   PROT 7.9 12/25/2014 1039   ALBUMIN 4.2 03/27/2017 2007   ALBUMIN 4.5 12/25/2014 1039   AST 37 03/27/2017 2007   AST 56 (H) 12/25/2014 1039   ALT 17 03/27/2017 2007   ALT 59 (H) 12/25/2014 1039   ALKPHOS 58 03/27/2017 2007   ALKPHOS 82 12/25/2014 1039   BILITOT 1.6 (H) 03/27/2017 2007   BILITOT 1.12 12/25/2014 1039   GFRNONAA 45 (L) 12/06/2018 2022   GFRAA 53 (L) 12/06/2018 2022   Lab Results  Component Value Date   CHOL 124 05/05/2013   HDL 43 05/05/2013   LDLCALC 67 05/05/2013   TRIG 72 05/05/2013   CHOLHDL 2.9 05/05/2013   Lab Results  Component Value Date   HGBA1C 4.5 05/04/2013   No results found for: VITAMINB12 No results found for: TSH    ASSESSMENT AND PLAN 58 y.o. year old Eugene [name]  has a past  medical history of Hypertension, Seizures ([place]), and Stroke ([place]). here with     ICD-10-Eugene   1. Partial seizure ([place])  R56.9   2. History of hemorrhagic stroke with residual hemiparesis [facility])  I69.359     Eugene Eugene [name] is doing well.  He is tolerating levetiracetam 1000mg  twice daily.  We will continue current therapy.  He will continue aspirin 81 mg and pravastatin as directed by PCP.  Seizure and strict precautions advised.  We will obtain release for most recent labs with primary care for evaluation.  He was advised to stay well-hydrated and work on healthy lifestyle habits.  He will continue close follow-up with primary care as well.  We will see him back in 1 year, sooner if needed.   No orders of the defined types were placed in this encounter.    No orders of the defined types were placed in this encounter.     I spent 15 minutes with the patient. 50% of this time was spent counseling and educating patient on plan of care and medications.    Debbora Presto, FNP-C 07/30/2019, 8:54 AM [facility] [address], [place], [place] [address] [phone]

## 2019-07-30 NOTE — Progress Notes (Signed)
Release faxed to [place] [phone] (with confirmation).

## 2019-08-05 NOTE — Progress Notes (Signed)
I reviewed note and agree with plan.   VIKRAM R. PENUMALLI, MD 08/05/2019, 1:42 PM Certified in Neurology, Neurophysiology and Neuroimaging  [facility] [address], [address] [place],  [address] [phone]  

## 2019-10-04 ENCOUNTER — Encounter (HOSPITAL_COMMUNITY): Payer: Self-pay

## 2019-10-04 ENCOUNTER — Other Ambulatory Visit: Payer: Self-pay

## 2019-10-04 ENCOUNTER — Ambulatory Visit (HOSPITAL_COMMUNITY)
Admission: EM | Admit: 2019-10-04 | Discharge: 2019-10-04 | Disposition: A | Discharge status: Home / Self Care | Payer: 59 | Attending: Family Medicine | Admitting: Family Medicine

## 2019-10-04 DIAGNOSIS — K047 Periapical abscess without sinus: Secondary | ICD-10-CM | POA: Diagnosis not present

## 2019-10-04 MED ORDER — METHYLPREDNISOLONE SODIUM SUCC 125 MG IJ SOLR
INTRAMUSCULAR | Status: AC
Start: 2019-10-04 — End: ?
  Filled 2019-10-04: qty 2

## 2019-10-04 MED ORDER — LIDOCAINE VISCOUS HCL 2 % MT SOLN: 15.0000 mL | OROMUCOSAL | 0 refills | Status: AC | PRN

## 2019-10-04 MED ORDER — CEFTRIAXONE SODIUM 1 G IJ SOLR
500.0000 mg | Freq: Once | INTRAMUSCULAR | Status: AC
  Administered 2019-10-04: 11:00:00 500 mg via INTRAMUSCULAR

## 2019-10-04 MED ORDER — PREDNISONE 20 MG PO TABS: 40.0000 mg | ORAL_TABLET | Freq: Every day | ORAL | 0 refills | Status: AC

## 2019-10-04 MED ORDER — CEFTRIAXONE SODIUM 1 G IJ SOLR
INTRAMUSCULAR | Status: AC
  Filled 2019-10-04: qty 10

## 2019-10-04 MED ORDER — LIDOCAINE HCL 2 % IJ SOLN
INTRAMUSCULAR | Status: AC
  Filled 2019-10-04: qty 20

## 2019-10-04 MED ORDER — METHYLPREDNISOLONE SODIUM SUCC 125 MG IJ SOLR
80.0000 mg | Freq: Once | INTRAMUSCULAR | Status: AC
  Administered 2019-10-04: 11:00:00 80 mg via INTRAMUSCULAR

## 2019-10-04 MED ORDER — AMOXICILLIN-POT CLAVULANATE 875-125 MG PO TABS: 1.0000 | ORAL_TABLET | Freq: Two times a day (BID) | ORAL | 0 refills | Status: AC

## 2019-10-04 NOTE — ED Provider Notes (Signed)
[facility]    CSN: [id number] Arrival date & time: 10/04/19  1884      History   Chief Complaint Chief Complaint  Patient presents with  . Dental Pain  . Oral Swelling    HPI Eugene [name] is a 58 y.o. male.   HPI  Presents with a complain of left sided facial swelling and dental abscess involving the upper left bridge of mouth involving upper molar tooth. Endorses chronic dental problems without recent routine dental care. Facial swelling developed two days prior and oral pain gradually worsened over the last 24 hours. Endorses chills. Denies measurable fever, nausea, or vomiting.  Past Medical History:  Diagnosis Date  . Hypertension   . Seizures ([place])   . Stroke [facility])     Patient Active Problem List   Diagnosis Date Noted  . Overweight (BMI 25.0-29.9) 03/29/2017  . Tibial plateau fracture, left, closed, initial encounter 03/27/2017  . Current smoker 05/07/2014  . Partial seizure ([place]) 05/24/2013  . TIA (transient ischemic attack) 05/04/2013  . History of hemorrhagic stroke with residual hemiparesis ([place]) 05/04/2013  . Hypertension 05/04/2013  . History of drug abuse ([place]) 05/04/2013  . Thrombocytopenia ([place]) 05/04/2013    Past Surgical History:  Procedure Laterality Date  . HERNIA REPAIR    . ORIF TIBIA PLATEAU Left 03/28/2017   Procedure: OPEN REDUCTION INTERNAL FIXATION (ORIF) TIBIAL PLATEAU;  Surgeon: Paralee Cancel, MD;  Location: [facility];  Service: Orthopedics;  Laterality: Left;       Home Medications    Prior to Admission medications   Medication Sig Start Date End Date Taking? Authorizing Provider  aspirin EC 81 MG tablet Take 81 mg by mouth daily.    [provider]  levETIRAcetam (KEPPRA) 1000 MG tablet Take 1 tablet (1,000 mg total) by mouth 2 (two) times daily. 07/17/18   Dennie Bible, NP  losartan-hydrochlorothiazide (HYZAAR) 100-25 MG per tablet Take 1 tablet by mouth every morning.  10/08/13   [provider]  Multiple Vitamin (MULTIVITAMIN WITH MINERALS) TABS Take 1 tablet by mouth every morning.    [provider]  mupirocin ointment (BACTROBAN) 2 % APPLY TO AFFECTED AREA EVERY DAY 06/28/19   [provider]  potassium chloride (MICRO-K) 10 MEQ CR capsule TAKE 1 CAPSULE BY MOUTH EVERY DAY WITH FOOD 07/06/19   [provider]  pravastatin (PRAVACHOL) 20 MG tablet Take 20 mg by mouth every morning.    [provider]  verapamil (CALAN) 80 MG tablet Take 80 mg by mouth 3 (three) times daily.      [provider]    Family History Family History  Problem Relation Age of Onset  . Asthma Mother   . Diabetes Mother   . Other Father        natural causes  . Stroke Paternal Grandmother   . Stroke Paternal Grandfather   . Heart disease Neg Hx     Social History Social History   Tobacco Use  . Smoking status: Current Every Day Smoker    Years: 0.00    Types: Cigarettes  . Smokeless tobacco: Never Used  . Tobacco comment: states he smokes maybe 2-3 cigarettes a day  Substance Use Topics  . Alcohol use: Yes  . Drug use: No     Allergies   Patient has no known allergies.   Review of Systems Review of Systems Pertinent negatives listed in HPI Physical Exam Triage Vital Signs ED Triage Vitals  Enc Vitals Group  BP 10/04/19 1005 (!) 145/82     Pulse Rate 10/04/19 1005 85     Resp 10/04/19 1005 17     Temp 10/04/19 1005 98.6 F (37 C)     Temp Source 10/04/19 1005 Oral     SpO2 10/04/19 1005 93 %     Weight --      Height --      Head Circumference --      Peak Flow --      Pain Score 10/04/19 1007 10     Pain Loc --      Pain Edu? --      Excl. in [place]? --    No data found.  Updated Vital Signs BP (!) 145/82 (BP Location: Left Arm)   Pulse 85   Temp 98.6 F (37 C) (Oral)   Resp 17   SpO2 93%   Visual Acuity Right Eye Distance:   Left Eye Distance:   Bilateral Distance:    Right Eye Near:   Left Eye Near:     Bilateral Near:     Physical Exam HENT:     Head:      Mouth/Throat:     Dentition: Abnormal dentition. Dental tenderness and dental abscesses present.  Cardiovascular:     Rate and Rhythm: Normal rate and regular rhythm.  Pulmonary:     Effort: Pulmonary effort is normal.     Breath sounds: Normal breath sounds.  Neurological:     General: No focal deficit present.     Mental Status: He is alert.      UC Treatments / Results  Labs (all labs ordered are listed, but only abnormal results are displayed) Labs Reviewed - No data to display  EKG   Radiology No results found.  Procedures Procedures (including critical care time)  Medications Ordered in UC Medications - No data to display  Initial Impression / Assessment and Plan / UC Course  I have reviewed the triage vital signs and the nursing notes.  Pertinent labs & imaging results that were available during my care of the patient were reviewed by me and considered in my medical decision making (see chart for details).    Dental abscess left upper gum and left sided facial swelling related to poor dentition. Antibiotic therapy prescribed for infection management and prednisone to reduce swelling and inflammation. Patient is return in 2 days for follow-up given the significance of facial swelling. Information provided to follow-up with a dentist on Monday. Final Clinical Impressions(s) / UC Diagnoses   Final diagnoses:  Dental abscess     Discharge Instructions     Return Sunday for follow-up and re-evaluation of facial swelling. Start Augmentin this evening with dinner. Take 1st dose of prednisone 40 mg (2 tablets) tomorrow morning with breakfast, and continue to take daily with breakfast Sunday and Monday.  Please contact the dental offices of to scheduled an appointment for further evaluation of dental problem: [facility]: Maylon Peppers DDS  [address] [place], [place], [place] [address] [phone]     ED Prescriptions    Medication Sig Dispense Auth. Provider   lidocaine (XYLOCAINE) 2 % solution Use as directed 15 mLs in the mouth or throat every 4 (four) hours as needed for mouth pain. 100 mL Bing Neighbors, FNP   amoxicillin-clavulanate (AUGMENTIN) 875-125 MG tablet Take 1 tablet by mouth 2 (two) times daily. 20 tablet Bing Neighbors, FNP   predniSONE (DELTASONE) 20 MG tablet  Take 2 tablets (40 mg total) by mouth daily with breakfast for 3 days. 6 tablet Bing NeighborsHarris, [employer] S, FNP     PDMP not reviewed this encounter.   Bing NeighborsHarris, Ledarrius Beauchaine S, FNP 10/06/19 1049

## 2019-10-04 NOTE — Discharge Instructions (Addendum)
Return Sunday for follow-up and re-evaluation of facial swelling. Start Augmentin this evening with dinner. Take 1st dose of prednisone 40 mg (2 tablets) tomorrow morning with breakfast, and continue to take daily with breakfast Sunday and Monday.  Please contact the dental offices of to scheduled an appointment for further evaluation of dental problem: [facility]: Ebbie Latus [place]  [place], [place], [place] [address] [phone]

## 2019-10-04 NOTE — ED Triage Notes (Signed)
Pt presents with left side oral swelling and dental pain X 2 days.

## 2019-10-06 ENCOUNTER — Ambulatory Visit (HOSPITAL_COMMUNITY)
Admission: EM | Admit: 2019-10-06 | Discharge: 2019-10-06 | Disposition: A | Discharge status: Home / Self Care | Payer: 59 | Attending: Family Medicine | Admitting: Family Medicine

## 2019-10-06 ENCOUNTER — Encounter (HOSPITAL_COMMUNITY): Payer: Self-pay | Admitting: Emergency Medicine

## 2019-10-06 ENCOUNTER — Other Ambulatory Visit: Payer: Self-pay

## 2019-10-06 DIAGNOSIS — Z09 Encounter for follow-up examination after completed treatment for conditions other than malignant neoplasm: Secondary | ICD-10-CM

## 2019-10-06 DIAGNOSIS — K047 Periapical abscess without sinus: Secondary | ICD-10-CM

## 2019-10-06 NOTE — Discharge Instructions (Signed)
Please continue with previously prescribed antibiotics.  Ibuprofen as needed. Warm compresses or ice application to face as needed.  Call dentists tomorrow to make appointment for further treatment.

## 2019-10-06 NOTE — ED Triage Notes (Signed)
Pt states he was here on Friday for an abscess in his mouth. States hes here for a follow up, states he feels like its healing but was told to return for recheck.

## 2019-10-06 NOTE — ED Provider Notes (Signed)
[facility]    CSN: [id number] Arrival date & time: 10/06/19  1014      History   Chief Complaint Chief Complaint  Patient presents with  . Follow-up    HPI Eugene [name] is a 58 y.o. male.   Eugene [name] presents for follow up related to dental abscess. Was seen her two days ago with pain and swelling which extended to left face, up near left eye, which was concerning. He was given steroid and IM rocephin, as well as oral Augmentin. Pain and swelling has since significantly improved. He reports feeling much improved. No fevers. Still taking antibiotics. Has plans to call dentists for follow up. History  Of htn, seizures, stroke.     ROS per HPI, negative if not otherwise mentioned.       Past Medical History:  Diagnosis Date  . Hypertension   . Seizures ([facility])   . Stroke [facility])     Patient Active Problem List   Diagnosis Date Noted  . Overweight (BMI 25.0-29.9) 03/29/2017  . Tibial plateau fracture, left, closed, initial encounter 03/27/2017  . Current smoker 05/07/2014  . Partial seizure ([facility]) 05/24/2013  . TIA (transient ischemic attack) 05/04/2013  . History of hemorrhagic stroke with residual hemiparesis ([facility]) 05/04/2013  . Hypertension 05/04/2013  . History of drug abuse ([facility]) 05/04/2013  . Thrombocytopenia ([facility]) 05/04/2013    Past Surgical History:  Procedure Laterality Date  . HERNIA REPAIR    . ORIF TIBIA PLATEAU Left 03/28/2017   Procedure: OPEN REDUCTION INTERNAL FIXATION (ORIF) TIBIAL PLATEAU;  Surgeon: Durene Romans, MD;  Location: [facility];  Service: Orthopedics;  Laterality: Left;       Home Medications    Prior to Admission medications   Medication Sig Start Date End Date Taking? Authorizing Provider  amoxicillin-clavulanate (AUGMENTIN) 875-125 MG tablet Take 1 tablet by mouth 2 (two) times daily. 10/04/19   Bing Neighbors, FNP  aspirin EC 81 MG tablet Take 81 mg by mouth daily.    [provider]  levETIRAcetam  (KEPPRA) 1000 MG tablet Take 1 tablet (1,000 mg total) by mouth 2 (two) times daily. 07/17/18   Nilda Riggs, NP  lidocaine (XYLOCAINE) 2 % solution Use as directed 15 mLs in the mouth or throat every 4 (four) hours as needed for mouth pain. 10/04/19   Bing Neighbors, FNP  losartan-hydrochlorothiazide (HYZAAR) 100-25 MG per tablet Take 1 tablet by mouth every morning.  10/08/13   [provider]  Multiple Vitamin (MULTIVITAMIN WITH MINERALS) TABS Take 1 tablet by mouth every morning.    [provider]  mupirocin ointment (BACTROBAN) 2 % APPLY TO AFFECTED AREA EVERY DAY 06/28/19   [provider]  potassium chloride (MICRO-K) 10 MEQ CR capsule TAKE 1 CAPSULE BY MOUTH EVERY DAY WITH FOOD 07/06/19   [provider]  pravastatin (PRAVACHOL) 20 MG tablet Take 20 mg by mouth every morning.    [provider]  predniSONE (DELTASONE) 20 MG tablet Take 2 tablets (40 mg total) by mouth daily with breakfast for 3 days. 10/04/19 10/07/19  Bing Neighbors, FNP  verapamil (CALAN) 80 MG tablet Take 80 mg by mouth 3 (three) times daily.      [provider]    Family History Family History  Problem Relation Age of Onset  . Asthma Mother   . Diabetes Mother   . Other Father        natural causes  . Stroke Paternal Grandmother   .  Stroke Paternal Grandfather   . Heart disease Neg Hx     Social History Social History   Tobacco Use  . Smoking status: Current Every Day Smoker    Years: 0.00    Types: Cigarettes  . Smokeless tobacco: Never Used  . Tobacco comment: states he smokes maybe 2-3 cigarettes a day  Substance Use Topics  . Alcohol use: Yes  . Drug use: No     Allergies   Patient has no known allergies.   Review of Systems Review of Systems   Physical Exam Triage Vital Signs ED Triage Vitals  Enc Vitals Group     BP 10/06/19 1048 (!) 142/81     Pulse Rate 10/06/19 1048 68     Resp 10/06/19 1048 16     Temp  10/06/19 1048 98.1 F (36.7 C)     Temp src --      SpO2 10/06/19 1048 100 %     Weight --      Height --      Head Circumference --      Peak Flow --      Pain Score 10/06/19 1051 0     Pain Loc --      Pain Edu? --      Excl. in [place]? --    No data found.  Updated Vital Signs BP (!) 142/81   Pulse 68   Temp 98.1 F (36.7 C)   Resp 16   SpO2 100%    Physical Exam Constitutional:      Appearance: He is well-developed.  HENT:     Head:     Comments: Very mild swelling still noted to left cheek; no tenderness, no tenderness at jaw line; no redness or warmth    Mouth/Throat:     Dentition: Abnormal dentition. No dental tenderness or dental abscesses.     Comments: No visible dental abscess present at this time  Cardiovascular:     Rate and Rhythm: Normal rate.  Pulmonary:     Effort: Pulmonary effort is normal.  Skin:    General: Skin is warm and dry.  Neurological:     Mental Status: He is alert and oriented to person, place, and time.      UC Treatments / Results  Labs (all labs ordered are listed, but only abnormal results are displayed) Labs Reviewed - No data to display  EKG   Radiology No results found.  Procedures Procedures (including critical care time)  Medications Ordered in UC Medications - No data to display  Initial Impression / Assessment and Plan / UC Course  I have reviewed the triage vital signs and the nursing notes.  Pertinent labs & imaging results that were available during my care of the patient were reviewed by me and considered in my medical decision making (see chart for details).     Patient with much improvement since last visit. To continue with augment and follow up with dentist. Return precautions provided. Patient verbalized understanding and agreeable to plan.   Final Clinical Impressions(s) / UC Diagnoses   Final diagnoses:  Dental abscess  Follow up     Discharge Instructions     Please continue with  previously prescribed antibiotics.  Ibuprofen as needed. Warm compresses or ice application to face as needed.  Call dentists tomorrow to make appointment for further treatment.    ED Prescriptions    None     PDMP not reviewed this encounter.   Augusto Gamble  B, NP 10/06/19 1103

## 2020-07-29 NOTE — Progress Notes (Deleted)
PATIENT: Eugene [name] DOB: 07/15/1961  REASON FOR VISIT: follow up HISTORY FROM: patient  No chief complaint on file.    HISTORY OF PRESENT ILLNESS: Today 07/29/20 Eugene [name] is a 59 y.o. male here today for follow up for seizure. He continues levetiracetam 1000mg  BID.   HISTORY: (copied from my note on 07/30/2019)  Eugene [name] is a 59 y.o. male here today for follow up for seizure disorder. He continues levetiracetam 1000mg  twice daily.  He is tolerating medication well without obvious adverse effects.  No seizure activity. No missed doses of medication. He is followed by primary care closely.  He continues aspirin 81 mg and pravastatin 20 mg.  Blood pressures have been normal.  Today's reading is 121/71.  He was last seen about 2 weeks ago.  He reports labs were drawn at that visit.  He is seen by [facility]. He does continue to wear right lower AFO.  He denies falls.  No strokelike symptoms.  He was evaluated in July ER for concerns of dehydration. He has increased water intake.  He states that he has felt great since. He is feeling well without complaints.  HISTORY: (copied from [employer] Martin's note on 07/17/2018)  UPDATE9/10/2019CMMr. Eugene [name], 59 year old male returns for follow-up with history of seizure disorder. He is currently on LEVthousand milligrams twice daily without side effects. In addition he is on aspirin. He has an AFO to his right lower leg since his stroke. He continues to work part-time. He does not drive.Blood pressure in the office today 129/80. Labs are followed by his primary care. He returns for reevaluation  UPDATE 06/20/17: Since last visit, no seizures. Tolerating LEV 1000mg  twice a day. Also had a trip and fall in May 2018, thel left tibial plate fracture, s/p surgery. Now recovered from left knee surgery.  UPDATE 05/16/16: Since last visit, doing well. No sz. Tolerating LEV. Some right arm weakness when overheated. Some  balance diff when not wearing brace in summertime (too hot to wear).   UPDATE 12/04/15: Since last visit, doing well. No new events. No sz. Tolerating meds.  UPDATE 09/03/15 (VRP): Since last visit, doing well. No seizures. Still drinking a little beer and smoking some cigarettes. Apparently, PCP checked labs 2 weeks ago, and due to some abnl, his LEV was reduced to 1000mg  BID and amantadine was added. Patient not sure why.   UPDATE 05/13/15 (VRP): Since last visit, doing well, no seizures. No new events. Tolerating meds.   UPDATE 11/10/14 (VRP): Since last visit, no seizures. Doing water aerobics for past 3 months. Slipped off some stairs last month, accidentally. No major injury fortunately.  UPDATE 05/07/14 (LL): Since last visit, doing well, no seizures or stroke symptoms. Having increased difficulty lately lifting right leg. Seems to be dragging it more. He is tolerating Keppra well, taking 1500 mg bid. Labs done recently at Dr. Mathews RobinsonsShelton's office were reportedly good. Blood pressure is well controlled, it is 114/73 in office today. He started back smoking cigarettes again, smoking less than half pack a day, knows he needs to quit.  UPDATE 11/11/13 LL: Patient returns for follow up and states he has been doing well, no convulsions since last visit. He reports that he has a tingling sensation in the right parietal area of his head sometimes which he states feels like when he used to smoke crack (which he hasn't done in many years), but no seizure follows. He is tolerating Keppra well, taking 1500 mg bid.  UPDATE 05/24/13: Since last visit, patient had one visit to emergency room for possible seizure versus stroke. Currently patient has been having dental problems, 2 extractions and was placed on antibiotics and pain medication. One week later, patient was on the bus and had fairly sudden onset feeling as though he might have a partial seizure. Previous partial seizures involve convulsions on the  right arm and right leg. Patient sat on the bus but did not go into convulsions. When he stood up he felt weakness in his right leg. Patient went to the hospital for further evaluation. He was admitted under TIA protocol. MRI of the brain was done which showed no acute findings. Patient was discharged on Plavix instead of aspirin. Since that time patient has done well. No further events. He does feel dizziness and has been seeing dark spots, since starting Plavix. He has not missed a seizure medications.   UPDATE 01/14/13: Doing well. Had 1 breakthrogh sz in Aug 2013, when he missed meds. Otherwise doing well.   UPDATE 02/01/12: Had another breakthrough sz (right arm/leg convulsions) in Feb 2013. Had arguement with daughter that evening by phone. Then had sz at 4am, when he woke up. No missed doses. No other factors.   PRIOR HPI: 59 year old right-handed male with history of intracerebral hemorrhage, here for evaluation of syncope versus seizure. The patient was admitted to [facility] in January 2012 for left frontal intracerebral hemorrhage with right upper and lower extremity weakness. He was discharged to rehabilitation in [facility]. He was doing well with improvement of right-sided weakness. On June 20, 2011, he was in the bathroom, shaving, when he developed lightheadedness, called to his sister for help, and fell to the ground. When she came to the bathroom she found him shaking for approximately one to 2 minutes. He was profusely sweating. No tongue biting or incontinence. After the episode, patient had significant right upper and lower extremity weakness. He was somewhat confused for several hours after the event. He went to the emergency room, had CT scan of the head and was discharged with a diagnosis of syncope. Since that time he has had no further events.   REVIEW OF SYSTEMS: Out of a complete 14 system review of symptoms, the patient complains only of the following  symptoms, and all other reviewed systems are negative.  ALLERGIES: No Known Allergies  HOME MEDICATIONS: Outpatient Medications Prior to Visit  Medication Sig Dispense Refill   amoxicillin-clavulanate (AUGMENTIN) 875-125 MG tablet Take 1 tablet by mouth 2 (two) times daily. 20 tablet 0   aspirin EC 81 MG tablet Take 81 mg by mouth daily.     levETIRAcetam (KEPPRA) 1000 MG tablet Take 1 tablet (1,000 mg total) by mouth 2 (two) times daily. 180 tablet 3   lidocaine (XYLOCAINE) 2 % solution Use as directed 15 mLs in the mouth or throat every 4 (four) hours as needed for mouth pain. 100 mL 0   losartan-hydrochlorothiazide (HYZAAR) 100-25 MG per tablet Take 1 tablet by mouth every morning.      Multiple Vitamin (MULTIVITAMIN WITH MINERALS) TABS Take 1 tablet by mouth every morning.     mupirocin ointment (BACTROBAN) 2 % APPLY TO AFFECTED AREA EVERY DAY     potassium chloride (MICRO-K) 10 MEQ CR capsule TAKE 1 CAPSULE BY MOUTH EVERY DAY WITH FOOD     pravastatin (PRAVACHOL) 20 MG tablet Take 20 mg by mouth every morning.     verapamil (CALAN) 80 MG tablet  Take 80 mg by mouth 3 (three) times daily.       No facility-administered medications prior to visit.    PAST MEDICAL HISTORY: Past Medical History:  Diagnosis Date   Hypertension    Seizures (HCC)    Stroke (HCC)     PAST SURGICAL HISTORY: Past Surgical History:  Procedure Laterality Date   HERNIA REPAIR     ORIF TIBIA PLATEAU Left 03/28/2017   Procedure: OPEN REDUCTION INTERNAL FIXATION (ORIF) TIBIAL PLATEAU;  Surgeon: Durene Romans, MD;  Location: [facility];  Service: Orthopedics;  Laterality: Left;    FAMILY HISTORY: Family History  Problem Relation Age of Onset   Asthma Mother    Diabetes Mother    Other Father        natural causes   Stroke Paternal Grandmother    Stroke Paternal Grandfather    Heart disease Neg Hx     SOCIAL HISTORY: Social History   Socioeconomic History   Marital status:  Single    Spouse name: Not on file   Number of children: 2   Years of education: 12   Highest education level: Not on file  Occupational History    Employer: UNEMPLOYED  Tobacco Use   Smoking status: Current Every Day Smoker    Years: 0.00    Types: Cigarettes   Smokeless tobacco: Never Used   Tobacco comment: states he smokes maybe 2-3 cigarettes a day  Substance and Sexual Activity   Alcohol use: Yes   Drug use: No   Sexual activity: Not Currently  Other Topics Concern   Not on file  Social History Narrative   Pt lives at home with caregiver    Pt is right handed   Education 12   Caffeine 0   Social Determinants of Health   Financial Resource Strain:    Difficulty of Paying Living Expenses: Not on file  Food Insecurity:    Worried About Programme researcher, broadcasting/film/video in the Last Year: Not on file   [employer] of Food in the Last Year: Not on file  Transportation Needs:    Lack of Transportation (Medical): Not on file   Lack of Transportation (Non-Medical): Not on file  Physical Activity:    Days of Exercise per Week: Not on file   Minutes of Exercise per Session: Not on file  Stress:    Feeling of Stress : Not on file  Social Connections:    Frequency of Communication with Friends and Family: Not on file   Frequency of Social Gatherings with Friends and Family: Not on file   Attends Religious Services: Not on file   Active Member of Clubs or Organizations: Not on file   Attends Banker Meetings: Not on file   Marital Status: Not on file  Intimate Partner Violence:    Fear of Current or Ex-Partner: Not on file   Emotionally Abused: Not on file   Physically Abused: Not on file   Sexually Abused: Not on file      PHYSICAL EXAM  There were no vitals filed for this visit. There is no height or weight on file to calculate BMI.  Generalized: Well developed, in no acute distress  Cardiology: normal rate and rhythm, no murmur  noted Respiratory: clear to auscultation bilaterally  Neurological examination  Mentation: Alert oriented to time, place, history taking. Follows all commands speech and language fluent Cranial nerve II-XII: Pupils were equal round reactive to light. Extraocular movements were full, visual field  were full on confrontational test. Facial sensation and strength were normal. Uvula tongue midline. Head turning and shoulder shrug  were normal and symmetric. Motor: The motor testing reveals 5 over 5 strength of all 4 extremities. Good symmetric motor tone is noted throughout.  Sensory: Sensory testing is intact to soft touch on all 4 extremities. No evidence of extinction is noted.  Coordination: Cerebellar testing reveals good finger-nose-finger and heel-to-shin bilaterally.  Gait and station: Gait is normal. Tandem gait is normal. Romberg is negative. No drift is seen.  Reflexes: Deep tendon reflexes are symmetric and normal bilaterally.   DIAGNOSTIC DATA (LABS, IMAGING, TESTING) - I reviewed patient records, labs, notes, testing and imaging myself where available.  No flowsheet data found.   Lab Results  Component Value Date   WBC 12.4 (H) 12/06/2018   HGB 13.9 12/06/2018   HCT 38.9 (L) 12/06/2018   MCV 82.9 12/06/2018   PLT 129 (L) 12/06/2018      Component Value Date/Time   NA 138 12/06/2018 2022   NA 139 12/25/2014 1039   K 3.2 (L) 12/06/2018 2022   K 3.8 12/25/2014 1039   CL 104 12/06/2018 2022   CO2 23 12/06/2018 2022   CO2 28 12/25/2014 1039   GLUCOSE 101 (H) 12/06/2018 2022   GLUCOSE 95 12/25/2014 1039   BUN 20 12/06/2018 2022   BUN 10.2 12/25/2014 1039   CREATININE 1.65 (H) 12/06/2018 2022   CREATININE 1.0 12/25/2014 1039   CALCIUM 9.9 12/06/2018 2022   CALCIUM 9.4 12/25/2014 1039   PROT 7.4 03/27/2017 2007   PROT 7.9 12/25/2014 1039   ALBUMIN 4.2 03/27/2017 2007   ALBUMIN 4.5 12/25/2014 1039   AST 37 03/27/2017 2007   AST 56 (H) 12/25/2014 1039   ALT 17 03/27/2017  2007   ALT 59 (H) 12/25/2014 1039   ALKPHOS 58 03/27/2017 2007   ALKPHOS 82 12/25/2014 1039   BILITOT 1.6 (H) 03/27/2017 2007   BILITOT 1.12 12/25/2014 1039   GFRNONAA 45 (L) 12/06/2018 2022   GFRAA 53 (L) 12/06/2018 2022   Lab Results  Component Value Date   CHOL 124 05/05/2013   HDL 43 05/05/2013   LDLCALC 67 05/05/2013   TRIG 72 05/05/2013   CHOLHDL 2.9 05/05/2013   Lab Results  Component Value Date   HGBA1C 4.5 05/04/2013   No results found for: VITAMINB12 No results found for: TSH     ASSESSMENT AND PLAN 59 y.o. year old male  has a past medical history of Hypertension, Seizures (HCC), and Stroke (HCC). here with ***    ICD-10-CM   1. Partial seizure (HCC)  R56.9        No orders of the defined types were placed in this encounter.    No orders of the defined types were placed in this encounter.     I spent 15 minutes with the patient. 50% of this time was spent counseling and educating patient on plan of care and medications.    Shawnie Dapper, FNP-C 07/29/2020, 4:03 PM [facility] [address], [address] [place], [place] [address] [phone]

## 2020-07-30 ENCOUNTER — Ambulatory Visit: Payer: 59 | Admitting: Family Medicine

## 2021-09-08 NOTE — Progress Notes (Deleted)
PATIENT: Eugene [name] DOB: Jan 06, 1961  REASON FOR VISIT: follow up HISTORY FROM: patient  No chief complaint on file.    HISTORY OF PRESENT ILLNESS: 09/08/21 ALL:  Eugene [name] returns for seizure follow up. Last seen 07/2019 and continued on levetiracetam 1000mg  BID.   07/30/2019 ALL:  Eugene [name] is a 60 y.o. male here today for follow up for seizure disorder. He continues levetiracetam 1000mg  twice daily.  He is tolerating medication well without obvious adverse effects.  No seizure activity. No missed doses of medication. He is followed by primary care closely.  He continues aspirin 81 mg and pravastatin 20 mg.  Blood pressures have been normal.  Today's reading is 121/71.  He was last seen about 2 weeks ago.  He reports labs were drawn at that visit.  He is seen by [facility]. He does continue to wear right lower AFO.  He denies falls.  No strokelike symptoms.  He was evaluated in July ER for concerns of dehydration. He has increased water intake.  He states that he has felt great since. He is feeling well without complaints.  HISTORY: (copied from Pendleton Martin's note on 07/17/2018)  UPDATE 9/10/2019CM Eugene [name], 60 year old male returns for follow-up with history of seizure disorder.  He is currently on LEV thousand milligrams twice daily without side effects.  In addition he is on aspirin.  He has an AFO to his right lower leg since his stroke.  He continues to work part-time.  He does not drive.  Blood pressure in the office today 129/80.  Labs are followed by his primary care.  He returns for reevaluation  UPDATE 06/20/17: Since last visit, no seizures. Tolerating LEV 1000mg  twice a day. Also had a trip and fall in May 2018, thel left tibial plate fracture, s/p surgery. Now recovered from left knee surgery.    UPDATE 05/16/16: Since last visit, doing well. No sz. Tolerating LEV. Some right arm weakness when overheated. Some balance diff when not wearing brace in  summertime (too hot to wear).    UPDATE 12/04/15: Since last visit, doing well. No new events. No sz. Tolerating meds.   UPDATE 09/03/15 (VRP): Since last visit, doing well. No seizures. Still drinking a little beer and smoking some cigarettes. Apparently, PCP checked labs 2 weeks ago, and due to some abnl, his LEV was reduced to 1000mg  BID and amantadine was added. Patient not sure why.    UPDATE 05/13/15 (VRP): Since last visit, doing well, no seizures. No new events. Tolerating meds.    UPDATE 11/10/14 (VRP): Since last visit, no seizures. Doing water aerobics for past 3 months. Slipped off some stairs last month, accidentally. No major injury fortunately.   UPDATE 05/07/14 (LL):  Since last visit, doing well, no seizures or stroke symptoms.  Having increased difficulty lately lifting right leg.  Seems to be dragging it more. He is tolerating Keppra well, taking 1500 mg bid. Labs done recently at Dr. office were reportedly good. Blood pressure is well controlled, it is 114/73 in office today. He started back smoking cigarettes again, smoking less than half pack a day, knows he needs to quit.   UPDATE 11/11/13 LL: Patient returns for follow up and states he has been doing well, no convulsions since last visit. He reports that he has a tingling sensation in the right parietal area of his head sometimes which he states feels like when he used to smoke crack (which he hasn't done in many  years), but no seizure follows. He is tolerating Keppra well, taking 1500 mg bid.    UPDATE 05/24/13: Since last visit, patient had one visit to emergency room for possible seizure versus stroke. Currently patient has been having dental problems, 2 extractions and was placed on antibiotics and pain medication. One week later, patient was on the bus and had fairly sudden onset feeling as though he might have a partial seizure. Previous partial seizures involve convulsions on the right arm and right leg. Patient sat on  the bus but did not go into convulsions. When he stood up he felt weakness in his right leg. Patient went to the hospital for further evaluation. He was admitted under TIA protocol. MRI of the brain was done which showed no acute findings. Patient was discharged on Plavix instead of aspirin. Since that time patient has done well. No further events. He does feel dizziness and has been seeing [name] spots, since starting Plavix. He has not missed a seizure medications.    UPDATE 01/14/13: Doing well. Had 1 breakthrogh sz in Aug 2013, when he missed meds. Otherwise doing well.    UPDATE 02/01/12: Had another breakthrough sz (right arm/leg convulsions) in Feb 2013. Had arguement with daughter that evening by phone. Then had sz at 4am, when he woke up. No missed doses. No other factors.    PRIOR HPI: 60 year old right-handed male with history of intracerebral hemorrhage, here for evaluation of syncope versus seizure. The patient was admitted to [facility] in January 2012 for left frontal intracerebral hemorrhage with right upper and lower extremity weakness. He was discharged to rehabilitation in [facility]. He was doing well with improvement of right-sided weakness. On June 20, 2011, he was in the bathroom, shaving, when he developed lightheadedness, called to his sister for help, and fell to the ground. When she came to the bathroom she found him shaking for approximately one to 2 minutes. He was profusely sweating. No tongue biting or incontinence. After the episode, patient had significant right upper and lower extremity weakness. He was somewhat confused for several hours after the event. He went to the emergency room, had CT scan of the head and was discharged with a diagnosis of syncope. Since that time he has had no further events.   REVIEW OF SYSTEMS: Out of a complete 14 system review of symptoms, the patient complains only of the following symptoms, none and all other reviewed  systems are negative.   ALLERGIES: No Known Allergies  HOME MEDICATIONS: Outpatient Medications Prior to Visit  Medication Sig Dispense Refill   amoxicillin-clavulanate (AUGMENTIN) 875-125 MG tablet Take 1 tablet by mouth 2 (two) times daily. 20 tablet 0   aspirin EC 81 MG tablet Take 81 mg by mouth daily.     levETIRAcetam (KEPPRA) 1000 MG tablet Take 1 tablet (1,000 mg total) by mouth 2 (two) times daily. 180 tablet 3   lidocaine (XYLOCAINE) 2 % solution Use as directed 15 mLs in the mouth or throat every 4 (four) hours as needed for mouth pain. 100 mL 0   losartan-hydrochlorothiazide (HYZAAR) 100-25 MG per tablet Take 1 tablet by mouth every morning.      Multiple Vitamin (MULTIVITAMIN WITH MINERALS) TABS Take 1 tablet by mouth every morning.     mupirocin ointment (BACTROBAN) 2 % APPLY TO AFFECTED AREA EVERY DAY     potassium chloride (MICRO-K) 10 MEQ CR capsule TAKE 1 CAPSULE BY MOUTH EVERY DAY WITH FOOD  pravastatin (PRAVACHOL) 20 MG tablet Take 20 mg by mouth every morning.     verapamil (CALAN) 80 MG tablet Take 80 mg by mouth 3 (three) times daily.       No facility-administered medications prior to visit.    PAST MEDICAL HISTORY: Past Medical History:  Diagnosis Date   Hypertension    Seizures (HCC)    Stroke (HCC)     PAST SURGICAL HISTORY: Past Surgical History:  Procedure Laterality Date   HERNIA REPAIR     ORIF TIBIA PLATEAU Left 03/28/2017   Procedure: OPEN REDUCTION INTERNAL FIXATION (ORIF) TIBIAL PLATEAU;  Surgeon: Durene Romans, MD;  Location: [facility];  Service: Orthopedics;  Laterality: Left;    FAMILY HISTORY: Family History  Problem Relation Age of Onset   Asthma Mother    Diabetes Mother    Other Father        natural causes   Stroke Paternal Grandmother    Stroke Paternal Grandfather    Heart disease Neg Hx     SOCIAL HISTORY: Social History   Socioeconomic History   Marital status: Single    Spouse name: Not on file   Number of  children: 2   Years of education: 12   Highest education level: Not on file  Occupational History    Employer: UNEMPLOYED  Tobacco Use   Smoking status: Every Day    Years: 0.00    Types: Cigarettes   Smokeless tobacco: Never   Tobacco comments:    states he smokes maybe 2-3 cigarettes a day  Substance and Sexual Activity   Alcohol use: Yes   Drug use: No   Sexual activity: Not Currently  Other Topics Concern   Not on file  Social History Narrative   Pt lives at home with caregiver    Pt is right handed   Education 12   Caffeine 0   Social Determinants of Health   Financial Resource Strain: Not on file  Food Insecurity: Not on file  Transportation Needs: Not on file  Physical Activity: Not on file  Stress: Not on file  Social Connections: Not on file  Intimate Partner Violence: Not on file      PHYSICAL EXAM  There were no vitals filed for this visit.  There is no height or weight on file to calculate BMI.  Generalized: Well developed, in no acute distress  Cardiology: normal rate and rhythm, no murmur noted Respiratory: Clear to auscultation bilaterally Neurological examination  Mentation: Alert oriented to time, place, history taking. Follows all commands speech and language fluent Cranial nerve II-XII: Pupils were equal round reactive to light. Extraocular movements were full, visual field were full on confrontational test. Facial sensation and strength were normal. Uvula tongue midline. Head turning and shoulder shrug  were normal and symmetric. Motor: The motor testing reveals 5 over 5 strength of bilateral upper and left lower extremities. 3+/4 of right lower with hip flexion. Good symmetric motor tone is noted throughout.  Sensory: Sensory testing is intact to soft touch on all 4 extremities. No evidence of extinction is noted.  Coordination: Cerebellar testing reveals good finger-nose-finger and heel-to-shin on left, right leg unable to test due to weakness.   Gait and station: Gait is   DIAGNOSTIC DATA (LABS, IMAGING, TESTING) - I reviewed patient records, labs, notes, testing and imaging myself where available.  No flowsheet data found.   Lab Results  Component Value Date   WBC 12.4 (H) 12/06/2018   HGB 13.9 12/06/2018  HCT 38.9 (L) 12/06/2018   MCV 82.9 12/06/2018   PLT 129 (L) 12/06/2018      Component Value Date/Time   NA 138 12/06/2018 2022   NA 139 12/25/2014 1039   K 3.2 (L) 12/06/2018 2022   K 3.8 12/25/2014 1039   CL 104 12/06/2018 2022   CO2 23 12/06/2018 2022   CO2 28 12/25/2014 1039   GLUCOSE 101 (H) 12/06/2018 2022   GLUCOSE 95 12/25/2014 1039   BUN 20 12/06/2018 2022   BUN 10.2 12/25/2014 1039   CREATININE 1.65 (H) 12/06/2018 2022   CREATININE 1.0 12/25/2014 1039   CALCIUM 9.9 12/06/2018 2022   CALCIUM 9.4 12/25/2014 1039   PROT 7.4 03/27/2017 2007   PROT 7.9 12/25/2014 1039   ALBUMIN 4.2 03/27/2017 2007   ALBUMIN 4.5 12/25/2014 1039   AST 37 03/27/2017 2007   AST 56 (H) 12/25/2014 1039   ALT 17 03/27/2017 2007   ALT 59 (H) 12/25/2014 1039   ALKPHOS 58 03/27/2017 2007   ALKPHOS 82 12/25/2014 1039   BILITOT 1.6 (H) 03/27/2017 2007   BILITOT 1.12 12/25/2014 1039   GFRNONAA 45 (L) 12/06/2018 2022   GFRAA 53 (L) 12/06/2018 2022   Lab Results  Component Value Date   CHOL 124 05/05/2013   HDL 43 05/05/2013   LDLCALC 67 05/05/2013   TRIG 72 05/05/2013   CHOLHDL 2.9 05/05/2013   Lab Results  Component Value Date   HGBA1C 4.5 05/04/2013   No results found for: VITAMINB12 No results found for: TSH    ASSESSMENT AND PLAN 60 y.o. year old male  has a past medical history of Hypertension, Seizures (HCC), and Stroke (HCC). here with   No diagnosis found.   Eugene [name] is doing well.  He is tolerating levetiracetam 1000mg  twice daily.  We will continue current therapy.  He will continue aspirin 81 mg and pravastatin as directed by PCP.  Seizure and strict precautions advised.  We will obtain release for  most recent labs with primary care for evaluation.  He was advised to stay well-hydrated and work on healthy lifestyle habits.  He will continue close follow-up with primary care as well.  We will see him back in 1 year, sooner if needed.   No orders of the defined types were placed in this encounter.    No orders of the defined types were placed in this encounter.     , FNP-C 09/08/2021, 12:29 PM [facility] [address] [place], [place] [phone]

## 2021-09-08 NOTE — Patient Instructions (Incomplete)

## 2021-09-09 ENCOUNTER — Encounter: Payer: Self-pay | Admitting: Family Medicine

## 2021-09-09 ENCOUNTER — Ambulatory Visit: Payer: Medicare Other | Admitting: Family Medicine

## 2021-09-09 DIAGNOSIS — R569 Unspecified convulsions: Secondary | ICD-10-CM
